# Patient Record
Sex: Female | Born: 1975 | Race: White | Hispanic: No | Marital: Married | Smoking: Former smoker
Health system: Southern US, Community
[De-identification: ages and names within clinical notes are randomized; demographics above are authoritative.]

## PROBLEM LIST (undated history)

## (undated) DIAGNOSIS — L732 Hidradenitis suppurativa: Secondary | ICD-10-CM

## (undated) DIAGNOSIS — E119 Type 2 diabetes mellitus without complications: Secondary | ICD-10-CM

## (undated) DIAGNOSIS — K3184 Gastroparesis: Secondary | ICD-10-CM

## (undated) DIAGNOSIS — E1140 Type 2 diabetes mellitus with diabetic neuropathy, unspecified: Secondary | ICD-10-CM

## (undated) DIAGNOSIS — G43909 Migraine, unspecified, not intractable, without status migrainosus: Secondary | ICD-10-CM

## (undated) HISTORY — PX: ANTERIOR CRUCIATE LIGAMENT REPAIR: SHX115

## (undated) HISTORY — PX: TUBAL LIGATION: SHX77

---

## 2006-11-19 ENCOUNTER — Emergency Department: Payer: Self-pay | Admitting: General Practice

## 2007-03-28 ENCOUNTER — Ambulatory Visit: Payer: Self-pay | Admitting: Specialist

## 2009-12-25 ENCOUNTER — Emergency Department: Payer: Self-pay | Admitting: Emergency Medicine

## 2011-11-26 ENCOUNTER — Emergency Department: Payer: Self-pay | Admitting: *Deleted

## 2011-11-27 ENCOUNTER — Emergency Department: Payer: Self-pay | Admitting: Emergency Medicine

## 2011-11-27 LAB — BASIC METABOLIC PANEL
Anion Gap: 7 (ref 7–16)
BUN: 8 mg/dL (ref 7–18)
Calcium, Total: 8.6 mg/dL (ref 8.5–10.1)
Co2: 26 mmol/L (ref 21–32)
Creatinine: 0.61 mg/dL (ref 0.60–1.30)
EGFR (African American): >60
Glucose: 131 mg/dL — ABNORMAL HIGH (ref 65–99)
Osmolality: 280 (ref 275–301)
Potassium: 3.7 mmol/L (ref 3.5–5.1)
Sodium: 140 mmol/L (ref 136–145)

## 2011-11-27 LAB — CBC WITH DIFFERENTIAL/PLATELET
Basophil %: 0.3 %
Eosinophil %: 3.1 %
Lymphocyte %: 16.7 %
MCH: 28.1 pg (ref 26.0–34.0)
MCV: 89 fL (ref 80–100)
Monocyte #: 0.6 x10 3/mm (ref 0.2–0.9)
Monocyte %: 4.3 %
Neutrophil #: 10.8 10*3/uL — ABNORMAL HIGH (ref 1.4–6.5)
Platelet: 300 10*3/uL (ref 150–440)
RDW: 14.6 % — ABNORMAL HIGH (ref 11.5–14.5)
WBC: 14.4 10*3/uL — ABNORMAL HIGH (ref 3.6–11.0)

## 2011-12-22 ENCOUNTER — Emergency Department: Payer: Self-pay | Admitting: *Deleted

## 2011-12-22 LAB — URINALYSIS, COMPLETE
Bacteria: NONE SEEN
Bilirubin,UR: NEGATIVE
Ketone: NEGATIVE
Leukocyte Esterase: NEGATIVE
Nitrite: NEGATIVE
Ph: 6 (ref 4.5–8.0)
Protein: 100
RBC,UR: 2519 /HPF (ref 0–5)
Squamous Epithelial: 2

## 2011-12-22 LAB — BASIC METABOLIC PANEL
Calcium, Total: 9.1 mg/dL (ref 8.5–10.1)
Chloride: 109 mmol/L — ABNORMAL HIGH (ref 98–107)
Creatinine: 0.8 mg/dL (ref 0.60–1.30)
EGFR (African American): >60
EGFR (Non-African Amer.): >60
Osmolality: 287 (ref 275–301)
Potassium: 3.8 mmol/L (ref 3.5–5.1)
Sodium: 141 mmol/L (ref 136–145)

## 2011-12-22 LAB — CBC
HCT: 37.9 % (ref 35.0–47.0)
HGB: 12.6 g/dL (ref 12.0–16.0)
MCHC: 33.2 g/dL (ref 32.0–36.0)
MCV: 88 fL (ref 80–100)
WBC: 9.6 10*3/uL (ref 3.6–11.0)

## 2011-12-22 LAB — PREGNANCY, URINE: Pregnancy Test, Urine: NEGATIVE m[IU]/mL

## 2012-04-24 ENCOUNTER — Emergency Department: Payer: Self-pay | Admitting: Emergency Medicine

## 2012-04-24 LAB — BASIC METABOLIC PANEL
BUN: 12 mg/dL (ref 7–18)
Calcium, Total: 9 mg/dL (ref 8.5–10.1)
Chloride: 102 mmol/L (ref 98–107)
Co2: 27 mmol/L (ref 21–32)
Potassium: 3.4 mmol/L — ABNORMAL LOW (ref 3.5–5.1)
Sodium: 138 mmol/L (ref 136–145)

## 2012-04-24 LAB — CBC
HCT: 36.5 % (ref 35.0–47.0)
MCH: 29.2 pg (ref 26.0–34.0)
Platelet: 315 10*3/uL (ref 150–440)
RBC: 4.23 10*6/uL (ref 3.80–5.20)
WBC: 12.4 10*3/uL — ABNORMAL HIGH (ref 3.6–11.0)

## 2012-04-24 LAB — ACETAMINOPHEN LEVEL: Acetaminophen: <2 ug/mL

## 2012-11-21 ENCOUNTER — Emergency Department: Payer: Self-pay | Admitting: Emergency Medicine

## 2012-11-21 LAB — URINALYSIS, COMPLETE
Bilirubin,UR: NEGATIVE
Blood: NEGATIVE
Ph: 5 (ref 4.5–8.0)
Protein: 100
RBC,UR: 1 /HPF (ref 0–5)
Specific Gravity: 1.028 (ref 1.003–1.030)
Squamous Epithelial: 3
WBC UR: 4 /HPF (ref 0–5)

## 2012-11-21 LAB — COMPREHENSIVE METABOLIC PANEL
Albumin: 3.2 g/dL — ABNORMAL LOW (ref 3.4–5.0)
Alkaline Phosphatase: 181 U/L — ABNORMAL HIGH (ref 50–136)
Anion Gap: 5 — ABNORMAL LOW (ref 7–16)
BUN: 9 mg/dL (ref 7–18)
Bilirubin,Total: 0.2 mg/dL (ref 0.2–1.0)
Calcium, Total: 9.5 mg/dL (ref 8.5–10.1)
Chloride: 101 mmol/L (ref 98–107)
Co2: 28 mmol/L (ref 21–32)
EGFR (African American): >60
Osmolality: 273 (ref 275–301)
SGPT (ALT): 32 U/L (ref 12–78)
Total Protein: 8.9 g/dL — ABNORMAL HIGH (ref 6.4–8.2)

## 2012-11-21 LAB — CBC
HCT: 35.9 % (ref 35.0–47.0)
MCHC: 32.8 g/dL (ref 32.0–36.0)
MCV: 84 fL (ref 80–100)
Platelet: 475 10*3/uL — ABNORMAL HIGH (ref 150–440)
RBC: 4.3 10*6/uL (ref 3.80–5.20)
WBC: 19.5 10*3/uL — ABNORMAL HIGH (ref 3.6–11.0)

## 2012-11-21 LAB — LIPASE, BLOOD: Lipase: 77 U/L (ref 73–393)

## 2012-11-27 LAB — CULTURE, BLOOD (SINGLE)

## 2013-05-29 ENCOUNTER — Ambulatory Visit: Payer: Self-pay | Admitting: Physician Assistant

## 2013-05-29 LAB — URINALYSIS, COMPLETE
Bilirubin,UR: NEGATIVE
Blood: NEGATIVE
Glucose,UR: NEGATIVE mg/dL (ref 0–75)
Ketone: NEGATIVE
Nitrite: NEGATIVE
Ph: 6 (ref 4.5–8.0)
RBC,UR: NONE SEEN /HPF (ref 0–5)

## 2013-07-25 ENCOUNTER — Ambulatory Visit: Payer: Self-pay | Admitting: Family Medicine

## 2013-08-20 ENCOUNTER — Ambulatory Visit: Payer: Self-pay | Admitting: Family Medicine

## 2014-01-05 ENCOUNTER — Ambulatory Visit: Payer: Self-pay | Admitting: Family Medicine

## 2014-01-05 LAB — CBC WITH DIFFERENTIAL/PLATELET
BASOS ABS: 0.1 10*3/uL (ref 0.0–0.1)
Basophil %: 0.7 %
Eosinophil #: 0.4 10*3/uL (ref 0.0–0.7)
Eosinophil %: 4.4 %
HCT: 39.3 % (ref 35.0–47.0)
HGB: 12.9 g/dL (ref 12.0–16.0)
Lymphocyte #: 2.4 10*3/uL (ref 1.0–3.6)
Lymphocyte %: 27.3 %
MCH: 28.2 pg (ref 26.0–34.0)
MCHC: 32.7 g/dL (ref 32.0–36.0)
MCV: 86 fL (ref 80–100)
MONOS PCT: 5.9 %
Monocyte #: 0.5 x10 3/mm (ref 0.2–0.9)
Neutrophil #: 5.5 10*3/uL (ref 1.4–6.5)
Neutrophil %: 61.7 %
Platelet: 304 10*3/uL (ref 150–440)
RBC: 4.56 10*6/uL (ref 3.80–5.20)
RDW: 14.2 % (ref 11.5–14.5)
WBC: 8.9 10*3/uL (ref 3.6–11.0)

## 2016-08-20 ENCOUNTER — Inpatient Hospital Stay
Admission: EM | Admit: 2016-08-20 | Discharge: 2016-08-21 | DRG: 872 | Disposition: A | Discharge status: Home / Self Care | Payer: Self-pay | Attending: Specialist | Admitting: Specialist

## 2016-08-20 ENCOUNTER — Encounter: Payer: Self-pay | Admitting: Emergency Medicine

## 2016-08-20 DIAGNOSIS — Z818 Family history of other mental and behavioral disorders: Secondary | ICD-10-CM

## 2016-08-20 DIAGNOSIS — A419 Sepsis, unspecified organism: Principal | ICD-10-CM | POA: Diagnosis present

## 2016-08-20 DIAGNOSIS — Z8249 Family history of ischemic heart disease and other diseases of the circulatory system: Secondary | ICD-10-CM

## 2016-08-20 DIAGNOSIS — I872 Venous insufficiency (chronic) (peripheral): Secondary | ICD-10-CM | POA: Diagnosis present

## 2016-08-20 DIAGNOSIS — Z599 Problem related to housing and economic circumstances, unspecified: Secondary | ICD-10-CM

## 2016-08-20 DIAGNOSIS — L732 Hidradenitis suppurativa: Secondary | ICD-10-CM | POA: Diagnosis present

## 2016-08-20 DIAGNOSIS — L723 Sebaceous cyst: Secondary | ICD-10-CM | POA: Diagnosis present

## 2016-08-20 DIAGNOSIS — G43909 Migraine, unspecified, not intractable, without status migrainosus: Secondary | ICD-10-CM | POA: Diagnosis present

## 2016-08-20 DIAGNOSIS — Z9119 Patient's noncompliance with other medical treatment and regimen: Secondary | ICD-10-CM

## 2016-08-20 DIAGNOSIS — L089 Local infection of the skin and subcutaneous tissue, unspecified: Secondary | ICD-10-CM

## 2016-08-20 DIAGNOSIS — Z833 Family history of diabetes mellitus: Secondary | ICD-10-CM

## 2016-08-20 DIAGNOSIS — L02811 Cutaneous abscess of head [any part, except face]: Secondary | ICD-10-CM | POA: Diagnosis present

## 2016-08-20 DIAGNOSIS — Z91030 Bee allergy status: Secondary | ICD-10-CM

## 2016-08-20 DIAGNOSIS — Z794 Long term (current) use of insulin: Secondary | ICD-10-CM

## 2016-08-20 DIAGNOSIS — E119 Type 2 diabetes mellitus without complications: Secondary | ICD-10-CM

## 2016-08-20 DIAGNOSIS — L03818 Cellulitis of other sites: Secondary | ICD-10-CM

## 2016-08-20 DIAGNOSIS — Z7982 Long term (current) use of aspirin: Secondary | ICD-10-CM

## 2016-08-20 DIAGNOSIS — E1165 Type 2 diabetes mellitus with hyperglycemia: Secondary | ICD-10-CM | POA: Diagnosis present

## 2016-08-20 HISTORY — DX: Type 2 diabetes mellitus without complications: E11.9

## 2016-08-20 HISTORY — DX: Migraine, unspecified, not intractable, without status migrainosus: G43.909

## 2016-08-20 HISTORY — DX: Hidradenitis suppurativa: L73.2

## 2016-08-20 LAB — CBC WITH DIFFERENTIAL/PLATELET
Basophils Absolute: 0.1 10*3/uL (ref 0–0.1)
Basophils Relative: 0 %
EOS ABS: 0.2 10*3/uL (ref 0–0.7)
EOS PCT: 1 %
HCT: 44.5 % (ref 35.0–47.0)
Hemoglobin: 15 g/dL (ref 12.0–16.0)
LYMPHS ABS: 2.4 10*3/uL (ref 1.0–3.6)
Lymphocytes Relative: 13 %
MCH: 28.5 pg (ref 26.0–34.0)
MCHC: 33.7 g/dL (ref 32.0–36.0)
MCV: 84.6 fL (ref 80.0–100.0)
MONOS PCT: 4 %
Monocytes Absolute: 0.8 10*3/uL (ref 0.2–0.9)
Neutro Abs: 15.8 10*3/uL — ABNORMAL HIGH (ref 1.4–6.5)
Neutrophils Relative %: 82 %
PLATELETS: 314 10*3/uL (ref 150–440)
RBC: 5.26 MIL/uL — AB (ref 3.80–5.20)
RDW: 13.7 % (ref 11.5–14.5)
WBC: 19.4 10*3/uL — AB (ref 3.6–11.0)

## 2016-08-20 LAB — COMPREHENSIVE METABOLIC PANEL
ALT: 20 U/L (ref 14–54)
ANION GAP: 13 (ref 5–15)
AST: 15 U/L (ref 15–41)
Albumin: 4 g/dL (ref 3.5–5.0)
Alkaline Phosphatase: 142 U/L — ABNORMAL HIGH (ref 38–126)
BUN: 11 mg/dL (ref 6–20)
CHLORIDE: 100 mmol/L — AB (ref 101–111)
CO2: 20 mmol/L — AB (ref 22–32)
Calcium: 9.2 mg/dL (ref 8.9–10.3)
Creatinine, Ser: 0.76 mg/dL (ref 0.44–1.00)
GFR calc non Af Amer: >60 mL/min (ref >60)
Glucose, Bld: 449 mg/dL — ABNORMAL HIGH (ref 65–99)
Potassium: 3.7 mmol/L (ref 3.5–5.1)
Sodium: 133 mmol/L — ABNORMAL LOW (ref 135–145)
Total Bilirubin: 1 mg/dL (ref 0.3–1.2)
Total Protein: 8.5 g/dL — ABNORMAL HIGH (ref 6.5–8.1)

## 2016-08-20 LAB — GLUCOSE, CAPILLARY
GLUCOSE-CAPILLARY: 435 mg/dL — AB (ref 65–99)
GLUCOSE-CAPILLARY: 369 mg/dL — AB (ref 65–99)
GLUCOSE-CAPILLARY: 293 mg/dL — AB (ref 65–99)
GLUCOSE-CAPILLARY: 326 mg/dL — AB (ref 65–99)
Glucose-Capillary: 387 mg/dL — ABNORMAL HIGH (ref 65–99)

## 2016-08-20 LAB — URINALYSIS, COMPLETE (UACMP) WITH MICROSCOPIC
Bilirubin Urine: NEGATIVE
HGB URINE DIPSTICK: NEGATIVE
KETONES UR: 80 mg/dL — AB
NITRITE: NEGATIVE
PH: 6 (ref 5.0–8.0)
PROTEIN: 30 mg/dL — AB
Specific Gravity, Urine: 1.037 — ABNORMAL HIGH (ref 1.005–1.030)

## 2016-08-20 LAB — LACTIC ACID, PLASMA: Lactic Acid, Venous: 1.6 mmol/L (ref 0.5–1.9)

## 2016-08-20 MED ORDER — ONDANSETRON HCL 4 MG/2ML IJ SOLN
4.0000 mg | Freq: Once | INTRAMUSCULAR | Status: AC
  Administered 2016-08-20: 4 mg via INTRAVENOUS
  Filled 2016-08-20: qty 2

## 2016-08-20 MED ORDER — ASPIRIN-ACETAMINOPHEN-CAFFEINE 250-250-65 MG PO TABS
1.0000 | ORAL_TABLET | Freq: Four times a day (QID) | ORAL | Status: DC | PRN
  Administered 2016-08-20 – 2016-08-21 (×3): 1 via ORAL
  Filled 2016-08-20 (×6): qty 1

## 2016-08-20 MED ORDER — VANCOMYCIN HCL 10 G IV SOLR
1250.0000 mg | Freq: Two times a day (BID) | INTRAVENOUS | Status: DC
  Administered 2016-08-20 – 2016-08-21 (×2): 1250 mg via INTRAVENOUS
  Filled 2016-08-20 (×3): qty 1250

## 2016-08-20 MED ORDER — SODIUM CHLORIDE 0.9 % IV BOLUS (SEPSIS)
1000.0000 mL | Freq: Once | INTRAVENOUS | Status: AC
  Administered 2016-08-20: 1000 mL via INTRAVENOUS

## 2016-08-20 MED ORDER — INSULIN ASPART 100 UNIT/ML ~~LOC~~ SOLN
0.0000 [IU] | Freq: Every day | SUBCUTANEOUS | Status: DC
  Administered 2016-08-20: 4 [IU] via SUBCUTANEOUS
  Filled 2016-08-20: qty 4

## 2016-08-20 MED ORDER — SODIUM CHLORIDE 0.9 % IV SOLN
INTRAVENOUS | Status: AC
  Administered 2016-08-20: 14:00:00 via INTRAVENOUS

## 2016-08-20 MED ORDER — ONDANSETRON HCL 4 MG PO TABS: 4.0000 mg | ORAL_TABLET | Freq: Four times a day (QID) | ORAL | Status: DC | PRN

## 2016-08-20 MED ORDER — CEFTRIAXONE SODIUM-DEXTROSE 1-3.74 GM-% IV SOLR
1.0000 g | Freq: Once | INTRAVENOUS | Status: AC
  Administered 2016-08-20: 1 g via INTRAVENOUS
  Filled 2016-08-20: qty 50

## 2016-08-20 MED ORDER — ENOXAPARIN SODIUM 40 MG/0.4ML ~~LOC~~ SOLN
40.0000 mg | SUBCUTANEOUS | Status: DC
  Administered 2016-08-20: 40 mg via SUBCUTANEOUS
  Filled 2016-08-20: qty 0.4

## 2016-08-20 MED ORDER — SODIUM CHLORIDE 0.9 % IV SOLN
Freq: Once | INTRAVENOUS | Status: AC
  Administered 2016-08-20: 12:00:00 via INTRAVENOUS

## 2016-08-20 MED ORDER — MORPHINE SULFATE (PF) 4 MG/ML IV SOLN
4.0000 mg | Freq: Once | INTRAVENOUS | Status: AC
  Administered 2016-08-20: 4 mg via INTRAVENOUS
  Filled 2016-08-20: qty 1

## 2016-08-20 MED ORDER — INSULIN ASPART 100 UNIT/ML ~~LOC~~ SOLN
0.0000 [IU] | Freq: Three times a day (TID) | SUBCUTANEOUS | Status: DC
  Administered 2016-08-20: 8 [IU] via SUBCUTANEOUS
  Administered 2016-08-20 – 2016-08-21 (×2): 15 [IU] via SUBCUTANEOUS
  Administered 2016-08-21: 8 [IU] via SUBCUTANEOUS
  Filled 2016-08-20: qty 15
  Filled 2016-08-20 (×2): qty 8
  Filled 2016-08-20: qty 15

## 2016-08-20 MED ORDER — ACETAMINOPHEN 325 MG PO TABS
650.0000 mg | ORAL_TABLET | Freq: Four times a day (QID) | ORAL | Status: DC | PRN
Start: 2016-08-20 — End: 2016-08-21
  Administered 2016-08-20 – 2016-08-21 (×2): 650 mg via ORAL
  Filled 2016-08-20: qty 2

## 2016-08-20 MED ORDER — LORATADINE 10 MG PO TABS
10.0000 mg | ORAL_TABLET | Freq: Every day | ORAL | Status: DC
  Administered 2016-08-21: 10 mg via ORAL
  Filled 2016-08-20: qty 1

## 2016-08-20 MED ORDER — LISINOPRIL 5 MG PO TABS
5.0000 mg | ORAL_TABLET | Freq: Every day | ORAL | Status: DC
  Administered 2016-08-21: 5 mg via ORAL
  Filled 2016-08-20: qty 1

## 2016-08-20 MED ORDER — INSULIN DETEMIR 100 UNIT/ML ~~LOC~~ SOLN
10.0000 [IU] | Freq: Two times a day (BID) | SUBCUTANEOUS | Status: DC
  Administered 2016-08-20 – 2016-08-21 (×2): 10 [IU] via SUBCUTANEOUS
  Filled 2016-08-20 (×3): qty 0.1

## 2016-08-20 MED ORDER — ASPIRIN EC 81 MG PO TBEC
81.0000 mg | DELAYED_RELEASE_TABLET | Freq: Every day | ORAL | Status: DC
  Administered 2016-08-21: 81 mg via ORAL
  Filled 2016-08-20: qty 1

## 2016-08-20 MED ORDER — PROCHLORPERAZINE EDISYLATE 5 MG/ML IJ SOLN
10.0000 mg | Freq: Once | INTRAMUSCULAR | Status: AC
  Administered 2016-08-20: 10 mg via INTRAVENOUS
  Filled 2016-08-20: qty 2

## 2016-08-20 MED ORDER — GABAPENTIN 300 MG PO CAPS
300.0000 mg | ORAL_CAPSULE | Freq: Every day | ORAL | Status: DC
  Administered 2016-08-20: 300 mg via ORAL
  Filled 2016-08-20: qty 1

## 2016-08-20 MED ORDER — SALINE SPRAY 0.65 % NA SOLN
1.0000 | NASAL | Status: DC | PRN
  Filled 2016-08-20: qty 44

## 2016-08-20 MED ORDER — DOCUSATE SODIUM 100 MG PO CAPS
100.0000 mg | ORAL_CAPSULE | Freq: Every day | ORAL | Status: DC
  Administered 2016-08-21: 100 mg via ORAL
  Filled 2016-08-20: qty 1

## 2016-08-20 MED ORDER — VANCOMYCIN HCL IN DEXTROSE 1-5 GM/200ML-% IV SOLN
1000.0000 mg | Freq: Once | INTRAVENOUS | Status: AC
  Administered 2016-08-20: 1000 mg via INTRAVENOUS
  Filled 2016-08-20: qty 200

## 2016-08-20 MED ORDER — ACETAMINOPHEN 650 MG RE SUPP: 650.0000 mg | Freq: Four times a day (QID) | RECTAL | Status: DC | PRN

## 2016-08-20 MED ORDER — ONDANSETRON HCL 4 MG/2ML IJ SOLN: 4.0000 mg | Freq: Four times a day (QID) | INTRAMUSCULAR | Status: DC | PRN

## 2016-08-20 MED ORDER — DEXTROSE 5 % IV SOLN: 1.0000 g | Freq: Once | INTRAVENOUS | Status: DC

## 2016-08-20 NOTE — Consult Note (Signed)
WOC Nurse wound consult note Reason for Consult: History of hidradenitis suppurativa.  ID has been consulted for large lesion to occiput area.  Draining and purulent.  Is on antibiotic coverage.  Other lesions are stable.  Will recommend silver skin fold management for any at risk areas.   Wound type:inflammatory/infectious Pressure Injury POA:N/A Measurement: 4 cm x 5 cm  Wound ZOX:WRUEAVWUJbed:purulence noted Drainage (amount, consistency, odor)purulent  Periwound: erythema and edematous Dressing procedure/placement/frequency:patient to bathe daily with soap and water and pat gently dry.  May use Interdry to any skin folds that have inflamed areas.  Measure and cut length of InterDry Ag+ to fit in skin folds that have skin breakdown Tuck InterDry  Ag+ fabric into skin folds in a single layer, allow for 2 inches of overhang from skin edges to allow for wicking to occur May remove to bathe; dry area thoroughly and then tuck into affected areas again Do not apply any creams or ointments when using InterDry Ag+ DO NOT THROW AWAY FOR 5 DAYS unless soiled with stool DO NOT [facility] product, this will inactivate the silver in the material  New sheet of Interdry Ag+ should be applied after 5 days of use if patient continues to have skin breakdown   Will not follow at this time.  Please re-consult if needed.  Maple HudsonKaren Linet Brash RN BSN CWON Pager [phone]

## 2016-08-20 NOTE — Progress Notes (Addendum)
Pharmacy Antibiotic Note  Brenda Mccarthy is a 41 y.o. female admitted on 08/20/2016 with Abscess on head/purulent drainage and Sepsis. Pharmacy has been consulted for Vancomycin dosing.  Hx hidradenitis suppurativa, DM.  Plan: Patient received CTX x 1 in ER and Vancomycin 1 gram IV x 1 in ER. Vancomycin 1250 IV every 12 hours.  Goal trough 15-20 mcg/mL. Will give stacked dosing.  Ke 0.084, t1/2 8.25  Vd 51.8 Vancomycin trough prior to 5th dose on 3/3.    Height: 5\' 9"  (175.3 cm) Weight: 189 lb (85.7 kg) IBW/kg (Calculated) : 66.2  Temp (24hrs), Avg:98.6 F (37 C), Min:98.1 F (36.7 C), Max:99 F (37.2 C)   Recent Labs Lab 08/20/16 1037 08/20/16 1140  WBC 19.4*  --   CREATININE 0.76  --   LATICACIDVEN  --  1.6    Estimated Creatinine Clearance: 107 mL/min (by C-G formula based on SCr of 0.76 mg/dL).    Allergies  Allergen Reactions  . Bee Venom Anaphylaxis  . Other     Throat swelling with bleach    Antimicrobials this admission: CTX x 1   >>   Vancomycin  3/1 >>    Dose adjustments this admission:    Microbiology results: 3/1 BCx: pending 3/1 UCx: pending    Sputum:      MRSA PCR:    Thank you for allowing pharmacy to be a part of this patient's care.  Zaxton Angerer A 08/20/2016 3:44 PM

## 2016-08-20 NOTE — Progress Notes (Signed)
[facility]. Patient is alert and oriented and able to verbalize needs. Oriented to staff and call bell system. IVF running at 16625ml/hr. Will continue to monitor.

## 2016-08-20 NOTE — ED Notes (Signed)
Patients daughter, Charline BillsKristyn [phone] was called and updated on patients room update per patients request.

## 2016-08-20 NOTE — Consult Note (Signed)
Patient ID: Brenda PinksElizabeth Michelle Mccarthy, female   DOB: 10/06/1975, 41 y.o.   MRN: [id number]  CC: Head Abscess  HPI Brenda Mccarthy is a 41 y.o. female who is currently admitted to the medicine service for an infected cyst on the back of her head. Surgery consult was requested by Dr.Gouru. Per the patient's report the areas been on the back of her head for numerous days and she's been attempting to pop it and drain it herself. She's had numerous of these on the back of her head in the past. She also is a known insulin-dependent diabetic who has not been taking any insulin. She also has a history of hidradenitis and takes a baseline doxycycline for prophylaxis. Patient denies any fevers, chills, nausea, vomiting, chest pain, shortness of breath, diarrhea, constipation. Her only real complaint is of pain to the area where she's been mashing on the cyst. It has spontaneously drained per the patient.  HPI  Past Medical History:  Diagnosis Date  . Diabetes mellitus without complication (HCC)   . Hydradenitis   . Migraine     Past Surgical History:  Procedure Laterality Date  . ANTERIOR CRUCIATE LIGAMENT REPAIR      Family History  Problem Relation Age of Onset  . Bipolar disorder Mother   . Diabetes Mother   . Heart disease Mother     Social History Social History  Substance Use Topics  . Smoking status: Never Smoker  . Smokeless tobacco: Never Used  . Alcohol use No    Allergies  Allergen Reactions  . Bee Venom Anaphylaxis  . Other     Throat swelling with bleach    Current Facility-Administered Medications  Medication Dose Route Frequency Provider Last Rate Last Dose  . 0.9 %  sodium chloride infusion   Intravenous Continuous Brenda LabAruna Gouru, MD 125 mL/hr at 08/20/16 1354    . acetaminophen (TYLENOL) tablet 650 mg  650 mg Oral Q6H PRN Brenda LabAruna Gouru, MD   650 mg at 08/20/16 1652   Or  . acetaminophen (TYLENOL) suppository 650 mg  650 mg Rectal Q6H PRN Brenda LabAruna Gouru, MD       . aspirin EC tablet 81 mg  81 mg Oral Daily Brenda LabAruna Gouru, MD      . aspirin-acetaminophen-caffeine (EXCEDRIN MIGRAINE) per tablet 1 tablet  1 tablet Oral Q6H PRN Brenda LabAruna Gouru, MD      . docusate sodium (COLACE) capsule 100 mg  100 mg Oral Daily Aruna Gouru, MD      . enoxaparin (LOVENOX) injection 40 mg  40 mg Subcutaneous Q24H Aruna Gouru, MD      . gabapentin (NEURONTIN) capsule 300 mg  300 mg Oral QHS Aruna Gouru, MD      . insulin aspart (novoLOG) injection 0-15 Units  0-15 Units Subcutaneous TID WC Brenda LabAruna Gouru, MD   8 Units at 08/20/16 1653  . insulin aspart (novoLOG) injection 0-5 Units  0-5 Units Subcutaneous QHS Aruna Gouru, MD      . insulin detemir (LEVEMIR) injection 10 Units  10 Units Subcutaneous BID Deanna ArtisAruna Gouru, MD      . lisinopril (PRINIVIL,ZESTRIL) tablet 5 mg  5 mg Oral Daily Aruna Gouru, MD      . loratadine (CLARITIN) tablet 10 mg  10 mg Oral Daily Aruna Gouru, MD      . ondansetron (ZOFRAN) tablet 4 mg  4 mg Oral Q6H PRN Brenda LabAruna Gouru, MD       Or  . ondansetron (ZOFRAN) injection 4 mg  4 mg Intravenous Q6H PRN Brenda Lab, MD      . sodium chloride (OCEAN) 0.65 % nasal spray 1 spray  1 spray Each Nare PRN Brenda Lab, MD      . vancomycin (VANCOCIN) 1,250 mg in sodium chloride 0.9 % 250 mL IVPB  1,250 mg Intravenous Q12H Brenda Lab, MD   1,250 mg at 08/20/16 1653     Review of Systems A Multi-point review of systems was asked and was negative except for the findings documented in the history of present illness  Physical Exam Blood pressure 127/75, pulse (!) 106, temperature 98.1 F (36.7 C), temperature source Oral, resp. rate 18, height 5\' 9"  (1.753 m), weight 85.7 kg (189 lb), last menstrual period 08/05/2016, SpO2 100 %. CONSTITUTIONAL: Resting in bed in no acute distress. EYES: Pupils are equal, round, and reactive to light, Sclera are non-icteric. EARS, NOSE, MOUTH AND THROAT: The oropharynx is clear. The oral mucosa is pink and moist. Hearing is intact to  voice. LYMPH NODES:  Lymph nodes in the neck are normal. RESPIRATORY:  Lungs are clear. There is normal respiratory effort, with equal breath sounds bilaterally, and without pathologic use of accessory muscles. CARDIOVASCULAR: Heart is regular without murmurs, gallops, or rubs. GI: The abdomen is soft, nontender, and nondistended. There are no palpable masses. There is no hepatosplenomegaly. There are normal bowel sounds in all quadrants. GU: Rectal deferred.   MUSCULOSKELETAL: Normal muscle strength and tone. No cyanosis or edema.   SKIN: Within the hair of the posterior head there is a 1 cm opening in the middle of an approximate 3 cm cyst. There is no palpable fluctuance. There is minimal hyperemia and induration. NEUROLOGIC: Motor and sensation is grossly normal. Cranial nerves are grossly intact. PSYCH:  Oriented to person, place and time. Affect is normal.  Data Reviewed labs reviewed; labs concerning for leukocytosis of 19.4, a hyperglycemia of 449, mild hyponatremia of 133 likely related to her hyperglycemia. Urinalysis reviewed which appears to be contaminated but does show evidence of bacteria and some leukocytes. There are no images to review I have personally reviewed the patient's imaging, laboratory findings and medical records.    Assessment    Uncontrolled diabetic with an infected sebaceous cyst    Plan    41 year old female admitted to the medicine service with presumed sepsis from infected cyst on the posterior scalp. No palpable induration or undrained fluid collection on exam. Minimal hyperemia. Patient states she actually feels better. At this point there is no indication for surgical intervention in this very poorly controlled diabetic. Recommend continuing antibiotic therapy. Should the area worsen would possibly consider I&D, however there is no current palpable fluid collection for drainage.     Time spent with the patient was 55 minutes, with more than 50% of the  time spent in face-to-face education, counseling and care coordination.     Ricarda Frame, MD FACS General Surgeon 08/20/2016, 5:10 PM

## 2016-08-20 NOTE — H&P (Signed)
[facility] - [place] at [facility]   PATIENT NAME: Brenda Mccarthy    MR#:  [id number]  DATE OF BIRTH:  01-12-1976  DATE OF ADMISSION:  08/20/2016  PRIMARY CARE PHYSICIAN: No primary care provider on file.   REQUESTING/REFERRING PHYSICIAN: Malinda  CHIEF COMPLAINT:   Infection on the back of the head HISTORY OF PRESENT ILLNESS:  Brenda Mccarthy  is a 41 y.o. female with a known history of Insulin requiring diabetes mellitus, ran out of her insulin approximately one year ago, hydradenitis suppurativa takes doxycycline once daily for prophylaxis, is presenting to the ED with a chief complaint of swelling on the back of her head. Her blood sugar is at 400 and patient was initially tachycardic and white count is elevated at 19.4. Abscess was noticed on the back of her head and patient is started on IV Rocephin and vancomycin. Hospitalist team is called to admit the patient  PAST MEDICAL HISTORY:   Past Medical History:  Diagnosis Date  . Diabetes mellitus without complication (HCC)   . Hydradenitis   . Migraine     PAST SURGICAL HISTOIRY:   Past Surgical History:  Procedure Laterality Date  . ANTERIOR CRUCIATE LIGAMENT REPAIR      SOCIAL HISTORY:   Social History  Substance Use Topics  . Smoking status: Never Smoker  . Smokeless tobacco: Never Used  . Alcohol use No    FAMILY HISTORY:  DM   DRUG ALLERGIES:   Allergies  Allergen Reactions  . Bee Venom Anaphylaxis  . Other     Throat swelling with bleach    REVIEW OF SYSTEMS:  CONSTITUTIONAL: No fever, fatigue ; Reporting weakness.  EYES: No blurred or double vision.  EARS, NOSE, AND THROAT: No tinnitus or ear pain.  RESPIRATORY: No cough, shortness of breath, wheezing or hemoptysis.  CARDIOVASCULAR: No chest pain, orthopnea, edema.  GASTROINTESTINAL: No nausea, vomiting, diarrhea or abdominal pain.  GENITOURINARY: No dysuria, hematuria.  ENDOCRINE: No polyuria, nocturia,   HEMATOLOGY: No anemia, easy bruising or bleeding SKIN: Swelling on the back of her head No rash or lesion. MUSCULOSKELETAL: No joint pain or arthritis.   NEUROLOGIC: No tingling, numbness, weakness.  PSYCHIATRY: No anxiety or depression.   MEDICATIONS AT HOME:   Prior to Admission medications   Medication Sig Start Date End Date Taking? Authorizing Provider  aspirin EC 81 MG tablet Take 81 mg by mouth daily.   Yes Historical Provider, MD  doxycycline (ADOXA) 100 MG tablet Take by mouth daily.    Yes Historical Provider, MD  lisinopril (PRINIVIL,ZESTRIL) 5 MG tablet Take 5 mg by mouth daily.   Yes Historical Provider, MD  aspirin-acetaminophen-caffeine (EXCEDRIN MIGRAINE) [phone] MG tablet Take by mouth every 6 (six) hours as needed for headache.    Historical Provider, MD  cetirizine (ZYRTEC) 10 MG tablet Take 10 mg by mouth daily.    Historical Provider, MD  gabapentin (NEURONTIN) 300 MG capsule Take 300 mg by mouth daily as needed.    Historical Provider, MD  silver sulfADIAZINE (SILVADENE) 1 % cream Apply 1 application topically daily as needed.    Historical Provider, MD  sodium chloride (OCEAN) 0.65 % SOLN nasal spray Place 1 spray into both nostrils as needed for congestion.    Historical Provider, MD      VITAL SIGNS:  Blood pressure 113/69, pulse 99, temperature 99 F (37.2 C), temperature source Oral, resp. rate 17, height 5\' 9"  (1.753 m), weight 85.7 kg (189 lb), last menstrual period  08/05/2016, SpO2 100 %.  PHYSICAL EXAMINATION:  GENERAL:  41 y.o.-year-old patient lying in the bed with no acute distress.  EYES: Pupils equal, round, reactive to light and accommodation. No scleral icterus. Extraocular muscles intact.  HEENT: Head atraumatic, normocephalic. Oropharynx and nasopharynx clear.  NECK:  Supple, no jugular venous distention. No thyroid enlargement, no tenderness.  LUNGS: Normal breath sounds bilaterally, no wheezing, rales,rhonchi or crepitation. No use of  accessory muscles of respiration.  CARDIOVASCULAR: S1, S2 normal. No murmurs, rubs, or gallops.  ABDOMEN: Soft, nontender, nondistended. Bowel sounds present. No organomegaly or mass.  EXTREMITIES: No pedal edema, cyanosis, or clubbing.  NEUROLOGIC: Cranial nerves II through XII are intact. Muscle strength 5/5 in all extremities. Sensation intact. Gait not checked.  PSYCHIATRIC: The patient is alert and oriented x 3.  SKIN:4 x 5 cm abscesses present in the occipital area is erythematous, tender with central crater no fluctuance  No obvious rash, lesion,   LABORATORY PANEL:   CBC  Recent Labs Lab 08/20/16 1037  WBC 19.4*  HGB 15.0  HCT 44.5  PLT 314   ------------------------------------------------------------------------------------------------------------------  Chemistries   Recent Labs Lab 08/20/16 1037  NA 133*  K 3.7  CL 100*  CO2 20*  GLUCOSE 449*  BUN 11  CREATININE 0.76  CALCIUM 9.2  AST 15  ALT 20  ALKPHOS 142*  BILITOT 1.0   ------------------------------------------------------------------------------------------------------------------  Cardiac Enzymes No results for input(s): TROPONINI in the last 168 hours. ------------------------------------------------------------------------------------------------------------------  RADIOLOGY:  No results found.  EKG:   Orders placed or performed during the hospital encounter of 08/20/16  . EKG 12-Lead  . EKG 12-Lead  . ED EKG  . ED EKG    IMPRESSION AND PLAN:   Brenda Mccarthy  is a 41 y.o. female with a known history of Insulin requiring diabetes mellitus, ran out of her insulin approximately one year ago, hydradenitis suppurativa is presenting to the ED with a chief complaint of swelling on the back of her head. Her blood sugar is at 400 and patient was initially tachycardic and white count is elevated at 19.4.  #Sepsis secondary to abscess in the occipital area of the scalp with history of  hydradenitis suppurativa Meets septic criteria with fever and tachycardia IV Rocephin and vancomycin were started in the emergency department will continue IV vancomycin Consult  surgery for possible I&D Pain management as needed  #Insulin requiring diabetes mellitus with hyperglycemia and noncompliance Patient was given 2 L fluid boluses We'll start her on Mod dose sliding Slade scale insulin and liver with 10 units twice a day. Patient was previously taking 75 units of Levemir twice a day which was discontinued by the patient because of the financial problems Consult diabetic coordinator Check hemoglobin A1c in a.m.  #History of hydradenitis suppurativa Patient was taking doxycycline once daily for prophylaxis. Will consider resuming doxycycline after completing the antibiotic course  #Chronic venous insufficiency Stable no interventions needed at this time  All the records are reviewed and case discussed with ED provider. Management plans discussed with the patient, family and they are in agreement.  CODE STATUS: fc , daughter is hcpoa  TOTAL TIME TAKING CARE OF THIS PATIENT: 45minutes.   Note: This dictation was prepared with Dragon dictation along with smaller phrase technology. Any transcriptional errors that result from this process are unintentional.  Ramonita LabGouru, Dedrick Heffner M.D on 08/20/2016 at 1:23 PM  Between 7am to 6pm - Pager - [phone]  After 6pm go to [email or web address] - password [email or web address]  [facility]  Office  [phone]  CC: Primary care physician; No primary care provider on file.

## 2016-08-20 NOTE — ED Provider Notes (Signed)
[facility] Emergency Department Provider Note   ____________________________________________   First MD Initiated Contact with Patient 08/20/16 1102     (approximate)  I have reviewed the triage vital signs and the nursing notes.   HISTORY  Chief Complaint Recurrent Skin Infections and Migraine  HPI Brenda Mccarthy is a 41 y.o. female who is diabetic but ran out of her diabetic medicines about a year ago and usually runs a blood sugar about 400 she says. She has recurrent boils and is supposed to be on doxycycline daily but only takes it when she has a problem because she cannot afford it. She developed a boil on the back of her neck on Sunday and popped it and a lot of pus out but it has since spread and gotten more tender now she is having a bad migraine as well. Bright light bothers her eyes a lot. She feels hot and cold as well.   Past Medical History:  Diagnosis Date  . Diabetes mellitus without complication (HCC)   . Hydradenitis   . Migraine     There are no active problems to display for this patient.   Past Surgical History:  Procedure Laterality Date  . ANTERIOR CRUCIATE LIGAMENT REPAIR      Prior to Admission medications   Not on File    Allergies Bee venom and Other  History reviewed. No pertinent family history.  Social History Social History  Substance Use Topics  . Smoking status: Never Smoker  . Smokeless tobacco: Never Used  . Alcohol use No    Review of Systems Constitutional:fever/chills Eyes: No visual changes. ENT: No sore throat. Cardiovascular: Denies chest pain. Respiratory: Denies shortness of breath. Gastrointestinal: No abdominal pain.  No nausea, no vomiting.  No diarrhea.  No constipation. Genitourinary: Negative for dysuria. Musculoskeletal: Negative for back pain. Skin: Negative for rash. Neurological: Negative for headaches, focal weakness or numbness.  10-point ROS otherwise  negative.  ____________________________________________   PHYSICAL EXAM:  VITAL SIGNS: ED Triage Vitals  Enc Vitals Group     BP 08/20/16 1027 139/83     Pulse Rate 08/20/16 1027 (!) 123     Resp 08/20/16 1055 17     Temp 08/20/16 1027 99 F (37.2 C)     Temp Source 08/20/16 1027 Oral     SpO2 08/20/16 1027 97 %     Weight 08/20/16 1019 189 lb (85.7 kg)     Height 08/20/16 1019 5\' 9"  (1.753 m)     Head Circumference --      Peak Flow --      Pain Score 08/20/16 1020 10     Pain Loc --      Pain Edu? --      Excl. in GC? --     Constitutional: Alert and oriented. Well appearing and in no acute distress. Eyes: Conjunctivae are normal. PERRL. EOMI. Head: Atraumatic Patient has a red area about the size of a $0.50 piece on the back of her head. It has an ulcer that is scabbed in it. It is very tender but not fluctuant. Patient also has a 1 cm area of firmness that is tender to the left and below that. He says that's what the not felt like before turned into a boil.. Nose: No congestion/rhinnorhea. Mouth/Throat: Mucous membranes are moist.  Oropharynx non-erythematous. Neck: No stridor. Neck is supple. Cardiovascular: Rapid rate, regular rhythm. Grossly normal heart sounds.  Good peripheral circulation. Respiratory: Normal respiratory effort.  No retractions. Lungs CTAB. Gastrointestinal: Soft and nontender. No distention. No abdominal bruits. No CVA tenderness.:  Musculoskeletal: No lower extremity tenderness nor edema.  No joint effusions. Neurologic:  Normal speech and language. No gross focal neurologic deficits are appreciated.  Skin patient reports she has a boil under her arm as well. I did not check that as she is lying on it and does not want to move because it hurts too much.  ____________________________________________   LABS (all labs ordered are listed, but only abnormal results are displayed)  Labs Reviewed  GLUCOSE, CAPILLARY - Abnormal; Notable for the  following:       Result Value   Glucose-Capillary 435 (*)    All other components within normal limits  CBC WITH DIFFERENTIAL/PLATELET - Abnormal; Notable for the following:    WBC 19.4 (*)    RBC 5.26 (*)    Neutro Abs 15.8 (*)    All other components within normal limits  COMPREHENSIVE METABOLIC PANEL - Abnormal; Notable for the following:    Sodium 133 (*)    Chloride 100 (*)    CO2 20 (*)    Glucose, Bld 449 (*)    Total Protein 8.5 (*)    Alkaline Phosphatase 142 (*)    All other components within normal limits  URINE CULTURE  LACTIC ACID, PLASMA  LACTIC ACID, PLASMA  URINALYSIS, COMPLETE (UACMP) WITH MICROSCOPIC  URINALYSIS, ROUTINE W REFLEX MICROSCOPIC  CBG MONITORING, ED  I-STAT CG4 LACTIC ACID, ED  I-STAT CG4 LACTIC ACID, ED   ____________________________________________  EKG  EKG read and interpreted by me shows sinus tachycardia rate of 127 left axis very poor R-wave progression. ____________________________________________  RADIOLOGY   ____________________________________________   PROCEDURES  Procedure(s) performed:  Procedures  Critical Care performed:   ____________________________________________   INITIAL IMPRESSION / ASSESSMENT AND PLAN / ED COURSE  Pertinent labs & imaging results that were available during my care of the patient were reviewed by me and considered in my medical decision making (see chart for details).        ____________________________________________   FINAL CLINICAL IMPRESSION(S) / ED DIAGNOSES  Final diagnoses:  Cellulitis of other specified site  Sepsis, due to unspecified organism [facility](HCC)      NEW MEDICATIONS STARTED DURING THIS VISIT:  New Prescriptions   No medications on file     Note:  This document was prepared using Dragon voice recognition software and may include unintentional dictation errors.    Arnaldo NatalPaul F Deston Bilyeu, MD 08/20/16 [phone]

## 2016-08-20 NOTE — ED Triage Notes (Signed)
Pt c/o pain to back of head since Saturday.  Hx multiple boils. Reports popped it and has been worse since. Redness similar to abscess to back of head.  Highest temp 99.1. Also c/o migraine.  Vomit when eats r/t pain.  NAD.

## 2016-08-20 NOTE — ED Notes (Signed)
Discussed pt with dr Darnelle Catalanmalinda. Labs ordered per recommendation r/t sugar

## 2016-08-20 NOTE — Progress Notes (Signed)
Patients heart rate is sustaining in 130s. Upon assessment patient has no complaints. Patient is in bed talking on phone with family. MD made aware.

## 2016-08-20 NOTE — Progress Notes (Signed)
Verbal order received for 1 liter NS bolus. IV bolus administered at this time. In room with patient. No complaints. HR in 120s. Will continue to monitor.

## 2016-08-21 LAB — COMPREHENSIVE METABOLIC PANEL
ALBUMIN: 3.2 g/dL — AB (ref 3.5–5.0)
ALK PHOS: 178 U/L — AB (ref 38–126)
ALT: 25 U/L (ref 14–54)
AST: 34 U/L (ref 15–41)
Anion gap: 8 (ref 5–15)
BILIRUBIN TOTAL: 0.8 mg/dL (ref 0.3–1.2)
BUN: 8 mg/dL (ref 6–20)
CALCIUM: 8.6 mg/dL — AB (ref 8.9–10.3)
CO2: 20 mmol/L — ABNORMAL LOW (ref 22–32)
Chloride: 106 mmol/L (ref 101–111)
Creatinine, Ser: 0.52 mg/dL (ref 0.44–1.00)
GFR calc Af Amer: >60 mL/min (ref >60)
GFR calc non Af Amer: >60 mL/min (ref >60)
GLUCOSE: 319 mg/dL — AB (ref 65–99)
Potassium: 3.4 mmol/L — ABNORMAL LOW (ref 3.5–5.1)
Sodium: 134 mmol/L — ABNORMAL LOW (ref 135–145)
TOTAL PROTEIN: 7 g/dL (ref 6.5–8.1)

## 2016-08-21 LAB — GLUCOSE, CAPILLARY
Glucose-Capillary: 278 mg/dL — ABNORMAL HIGH (ref 65–99)
Glucose-Capillary: 362 mg/dL — ABNORMAL HIGH (ref 65–99)

## 2016-08-21 LAB — TSH: TSH: 0.601 u[IU]/mL (ref 0.350–4.500)

## 2016-08-21 LAB — CBC
HEMATOCRIT: 37.5 % (ref 35.0–47.0)
Hemoglobin: 12.3 g/dL (ref 12.0–16.0)
MCH: 28 pg (ref 26.0–34.0)
MCHC: 32.9 g/dL (ref 32.0–36.0)
MCV: 85 fL (ref 80.0–100.0)
Platelets: 265 10*3/uL (ref 150–440)
RBC: 4.4 MIL/uL (ref 3.80–5.20)
RDW: 13.4 % (ref 11.5–14.5)
WBC: 14.9 10*3/uL — AB (ref 3.6–11.0)

## 2016-08-21 LAB — HIV ANTIBODY (ROUTINE TESTING W REFLEX): HIV Screen 4th Generation wRfx: NONREACTIVE

## 2016-08-21 MED ORDER — INSULIN NPH ISOPHANE & REGULAR (70-30) 100 UNIT/ML ~~LOC~~ SUSP: 18.0000 [IU] | Freq: Two times a day (BID) | SUBCUTANEOUS | 11 refills | Status: DC

## 2016-08-21 MED ORDER — SULFAMETHOXAZOLE-TRIMETHOPRIM 800-160 MG PO TABS: 1.0000 | ORAL_TABLET | Freq: Two times a day (BID) | ORAL | 0 refills | Status: AC

## 2016-08-21 NOTE — Progress Notes (Signed)
  Patient revisited with today. Patient reports that she had a headache overnight otherwise denies any complaints. She thinks the area on the posterior scalp is improved. Patient reports she thinks it drained some onto her bili last night. On exam the posterior infected sebaceous cyst is smaller in size and continues did not show any evidence of fluctuance or expressible fluid.  Doubtful that this small infected sebaceous cyst as the cause of systemic sepsis. However, there is no current surgically drainable fluid collection seen on examination.  Patient will need an outpatient surgical evaluation in 1-2 weeks for possible removal of the cyst when it is not infected.  Please call if the Gen. surgery team can be of any further assistance during this patient's hospital stay.  Ricarda Frameharles Posey Petrik, MD [facility] General Surgeon [facility]  Day [facility] [phone]-7p) [phone] Night [facility] [phone](7p-7a) [phone]

## 2016-08-21 NOTE — Care Management Note (Addendum)
Case Management Note  Patient Details  Name: Brenda Mccarthy MRN: [id number] Date of Birth: 11/11/1975  Subjective/Objective:   [facility]. Referral sent to Smiley HousemanLorrie [facility]. Patient has no PCP and has not taken any of her medications in over a year. She lives at home with her spouse. No other issues identified. Noted diabetic coordinators recommendations               Action/Plan: Verified that [facility] has 70/30 and they do  Expected Discharge Date:  08/21/16               Expected Discharge Plan:  Home/Self Care  In-House Referral:     Discharge planning Services  CM Consult, Indigent Health Clinic, Medication Assistance  Post Acute Care Choice:    Choice offered to:  Patient  DME Arranged:    DME Agency:     HH Arranged:    HH Agency:     Status of Service:  Completed, signed off  If discussed at [employer] Meetings, dates discussed:    Additional Comments:  Marily MemosLisa M Dossie Ocanas, RN 08/21/2016, 1:43 PM

## 2016-08-21 NOTE — Discharge Summary (Signed)
[employer] - [place] at [facility]   PATIENT NAME: Brenda Mccarthy    MR#:  [id number]  DATE OF BIRTH:  1976/04/17  DATE OF ADMISSION:  08/20/2016 ADMITTING PHYSICIAN: Ramonita Lab, MD  DATE OF DISCHARGE: 08/21/2016  PRIMARY CARE PHYSICIAN: No primary care provider on file.    ADMISSION DIAGNOSIS:  Sepsis, due to unspecified organism (HCC) [A41.9] Cellulitis of other specified site [L03.818]  DISCHARGE DIAGNOSIS:  Active Problems:   Sepsis (HCC)   Infected sebaceous cyst of skin   SECONDARY DIAGNOSIS:   Past Medical History:  Diagnosis Date  . Diabetes mellitus without complication (HCC)   . Hydradenitis   . Migraine     HOSPITAL COURSE:   41 yo female w/ hx of DM, hx of Hydradenitis suppurativa, DM who presented to the hospital with due to sepsis due to occipital celllulitis/hydradenitis suppurativa.   1. Sepsis - due to occipital cellulitis/Hidradenitis suppurativa. Patient presented with fever, leukocytosis and tachycardia. Patient was treated with IV vancomycin and Rocephin in the ER and started on IV vancomycin. A surgical consultation was obtained for possible incision and drainage. -Patient did not require any incision and drainage and her white cell count has improved and normalized with just IV antibiotics. Her swelling has improved, her fever resolved, patient is not being discharged on oral Bactrim and close follow-up with general surgery as an outpatient.  2. Type 2 diabetes insulin requiring-patient was noncompliant and not taking any insulin due to financial reasons. -Patient was seen by diabetes coordinator and was discharged on Novolin 70/30 18 units twice daily.  3. History of hidradenitis suppurativa-patient will continue her doxycycline upon discharge.  4. Occipital cellulitis -patient was treated with IV antibiotics with vancomycin in the hospital and not being discharged on oral Bactrim. -She will follow-up with general surgery as an  outpatient to see if she requires outpatient incision and drainage.  DISCHARGE CONDITIONS:   Stable.   CONSULTS OBTAINED:  Treatment Team:  Ricarda Frame, MD  DRUG ALLERGIES:   Allergies  Allergen Reactions  . Bee Venom Anaphylaxis  . Other     Throat swelling with bleach    DISCHARGE MEDICATIONS:   Allergies as of 08/21/2016      Reactions   Bee Venom Anaphylaxis   Other    Throat swelling with bleach      Medication List    TAKE these medications   aspirin EC 81 MG tablet Take 81 mg by mouth daily.   aspirin-acetaminophen-caffeine 250-250-65 MG tablet Commonly known as:  EXCEDRIN MIGRAINE Take by mouth every 6 (six) hours as needed for headache.   cetirizine 10 MG tablet Commonly known as:  ZYRTEC Take 10 mg by mouth daily.   doxycycline 100 MG tablet Commonly known as:  ADOXA Take by mouth daily.   gabapentin 300 MG capsule Commonly known as:  NEURONTIN Take 300 mg by mouth daily as needed.   insulin NPH-regular Human (70-30) 100 UNIT/ML injection Commonly known as:  NOVOLIN 70/30 Inject 18 Units into the skin 2 (two) times daily with a meal.   lisinopril 5 MG tablet Commonly known as:  PRINIVIL,ZESTRIL Take 5 mg by mouth daily.   silver sulfADIAZINE 1 % cream Commonly known as:  SILVADENE Apply 1 application topically daily as needed.   sodium chloride 0.65 % Soln nasal spray Commonly known as:  OCEAN Place 1 spray into both nostrils as needed for congestion.   sulfamethoxazole-trimethoprim 800-160 MG tablet Commonly known as:  BACTRIM DS,SEPTRA DS Take  1 tablet by mouth 2 (two) times daily.         DISCHARGE INSTRUCTIONS:   DIET:  Cardiac diet  DISCHARGE CONDITION:  Stable  ACTIVITY:  Activity as tolerated  OXYGEN:  Home Oxygen: No.   Oxygen Delivery: room air  DISCHARGE LOCATION:  home   If you experience worsening of your admission symptoms, develop shortness of breath, life threatening emergency, suicidal or  homicidal thoughts you must seek medical attention immediately by calling 911 or calling your MD immediately  if symptoms less severe.  You Must read complete instructions/literature along with all the possible adverse reactions/side effects for all the Medicines you take and that have been prescribed to you. Take any new Medicines after you have completely understood and accpet all the possible adverse reactions/side effects.   Please note  You were cared for by a hospitalist during your hospital stay. If you have any questions about your discharge medications or the care you received while you were in the hospital after you are discharged, you can call the unit and asked to speak with the hospitalist on call if the hospitalist that took care of you is not available. Once you are discharged, your primary care physician will handle any further medical issues. Please note that NO REFILLS for any discharge medications will be authorized once you are discharged, as it is imperative that you return to your primary care physician (or establish a relationship with a primary care physician if you do not have one) for your aftercare needs so that they can reassess your need for medications and monitor your lab values.     Today   Swelling in the Occipital area has improved.  NO fever, WBC count improving, no need for I &D. Will d/c home today.   VITAL SIGNS:  Blood pressure 130/71, pulse 99, temperature 98.2 F (36.8 C), temperature source Oral, resp. rate 19, height 5\' 9"  (1.753 m), weight 85.7 kg (189 lb), last menstrual period 08/05/2016, SpO2 100 %.  I/O:   Intake/Output Summary (Last 24 hours) at 08/21/16 1433 Last data filed at 08/21/16 0618  Gross per 24 hour  Intake           2467.5 ml  Output                0 ml  Net           2467.5 ml    PHYSICAL EXAMINATION:  GENERAL:  41 y.o.-year-old patient lying in the bed with no acute distress.  EYES: Pupils equal, round, reactive to light  and accommodation. No scleral icterus. Extraocular muscles intact.  HEENT: Head atraumatic, normocephalic. Oropharynx and nasopharynx clear.  NECK:  Supple, no jugular venous distention. No thyroid enlargement, no tenderness.  LUNGS: Normal breath sounds bilaterally, no wheezing, rales,rhonchi. No use of accessory muscles of respiration.  CARDIOVASCULAR: S1, S2 normal. No murmurs, rubs, or gallops.  ABDOMEN: Soft, non-tender, non-distended. Bowel sounds present. No organomegaly or mass.  EXTREMITIES: No pedal edema, cyanosis, or clubbing.  NEUROLOGIC: Cranial nerves II through XII are intact. No focal motor or sensory defecits b/l.  PSYCHIATRIC: The patient is alert and oriented x 3.  SKIN: No obvious rash, lesion, or ulcer. Occipital area cellulitis with slight induration and open area with no acute drainage noted.    DATA REVIEW:   CBC  Recent Labs Lab 08/21/16 0450  WBC 14.9*  HGB 12.3  HCT 37.5  PLT 265    Chemistries   Recent Labs  Lab 08/21/16 0450  NA 134*  K 3.4*  CL 106  CO2 20*  GLUCOSE 319*  BUN 8  CREATININE 0.52  CALCIUM 8.6*  AST 34  ALT 25  ALKPHOS 178*  BILITOT 0.8    Cardiac Enzymes No results for input(s): TROPONINI in the last 168 hours.  Microbiology Results  Results for orders placed or performed during the hospital encounter of 08/20/16  Urine culture     Status: Abnormal (Preliminary result)   Collection Time: 08/20/16 11:40 AM  Result Value Ref Range Status   Specimen Description URINE, CLEAN CATCH  Final   Special Requests NONE  Final   Culture (A)  Final    50,000 COLONIES/mL ESCHERICHIA COLI SUSCEPTIBILITIES TO FOLLOW Performed at [facility], [address] [place], [place] [address]    Report Status PENDING  Incomplete  CULTURE, BLOOD (ROUTINE X 2) w Reflex to ID Panel     Status: None (Preliminary result)   Collection Time: 08/20/16 11:40 AM  Result Value Ref Range Status   Specimen Description BLOOD LEFT AC  Final    Special Requests BOTTLES DRAWN AEROBIC AND ANAEROBIC BCHV  Final   Culture NO GROWTH < 24 HOURS  Final   Report Status PENDING  Incomplete  CULTURE, BLOOD (ROUTINE X 2) w Reflex to ID Panel     Status: None (Preliminary result)   Collection Time: 08/20/16 11:41 AM  Result Value Ref Range Status   Specimen Description BLOOD RIGHT AC  Final   Special Requests BOTTLES DRAWN AEROBIC AND ANAEROBIC BCAV  Final   Culture NO GROWTH < 24 HOURS  Final   Report Status PENDING  Incomplete    RADIOLOGY:  No results found.    Management plans discussed with the patient, family and they are in agreement.  CODE STATUS:     Code Status Orders        Start     Ordered   08/20/16 1339  Full code  Continuous     08/20/16 1338    Code Status History    Date Active Date Inactive Code Status Order ID Comments User Context   This patient has a current code status but no historical code status.      TOTAL TIME TAKING CARE OF THIS PATIENT: 40 minutes.    Houston SirenSAINANI,Kolette Vey J M.D on 08/21/2016 at 2:33 PM  Between 7am to 6pm - Pager - [phone]  After 6pm go to [email or web address] - Scientist, research (life sciences)password EPAS [facility]  [employer]  Hospitalists  Office  [phone]  CC: Primary care physician; No primary care provider on file.

## 2016-08-21 NOTE — Progress Notes (Signed)
Pharmacy Antibiotic Note  Brenda Mccarthy is a 41 y.o. female admitted on 08/20/2016 with Abscess on head/purulent drainage and Sepsis. Pharmacy has been consulted for Vancomycin dosing.  Hx hidradenitis suppurativa, DM.  Plan: Vancomycin 1250 IV every 12 hours.  Goal trough 15-20 mcg/mL. Vancomycin trough prior to 5th dose on 3/3.    Height: 5\' 9"  (175.3 cm) Weight: 189 lb (85.7 kg) IBW/kg (Calculated) : 66.2  Temp (24hrs), Avg:98.6 F (37 C), Min:97.8 F (36.6 C), Max:99.4 F (37.4 C)   Recent Labs Lab 08/20/16 1037 08/20/16 1140 08/21/16 0450  WBC 19.4*  --  14.9*  CREATININE 0.76  --  0.52  LATICACIDVEN  --  1.6  --     Estimated Creatinine Clearance: 107 mL/min (by C-G formula based on SCr of 0.52 mg/dL).    Allergies  Allergen Reactions  . Bee Venom Anaphylaxis  . Other     Throat swelling with bleach    Antimicrobials this admission: CTX x 1   >>   Vancomycin  3/1 >>    Dose adjustments this admission:    Microbiology results: 3/1 BCx: NGTD 3/1 UCx: pending   Thank you for allowing pharmacy to be a part of this patient's care.  Brenda Mccarthy, Brenda Mccarthy D 08/21/2016 9:49 AM

## 2016-08-21 NOTE — Progress Notes (Addendum)
Inpatient Diabetes Program Recommendations  AACE/ADA: New Consensus Statement on Inpatient Glycemic Control (2015)  Target Ranges:  Prepandial:   less than 140 mg/dL      Peak postprandial:   less than 180 mg/dL (1-2 hours)      Critically ill patients:  140 - 180 mg/dL   Results for Brenda Mccarthy, Brenda Mccarthy (MRN [id number]) as of 08/21/2016 07:38  Ref. Range 08/20/2016 10:35 08/20/2016 12:57 08/20/2016 13:52 08/20/2016 16:22 08/20/2016 21:52  Glucose-Capillary Latest Ref Range: 65 - 99 mg/dL 409435 (H) 811387 (H) 914369 (H) 293 (H) 326 (H)   Review of Glycemic Control  Diabetes history: DM2 Outpatient Diabetes medications: None listed on home med list (per pt she use to take Levemir 75 daily and Glipizide XL 10 BID but stopped due to financial issues) Current orders for Inpatient glycemic control: Levemir 10 units BID, Novolog 0-15 units TID with meals, Novolog 0-5 units QHS  Inpatient Diabetes Program Recommendations: Insulin - Basal: Please discontinue Levemir and order 70/30 18 units BID with meals (breakfast and supper) starting now. This dose of 70/30 will provide a total of 25 units for basal and 11 unit for meal coverage per day. HgbA1C: A1C in process. Outpatient Referral: Will need to establish care with PCP and follow up as outpatient regarding DM control.  NOTE: In reviewing chart, noted patient does not have any insurance nor a PCP. Will place consult for CM for assistance with medications and establishing follow up care. Plan to talk with patient today.  Addendum 08/21/16@11 :40-Spoke with patient about diabetes and home regimen for diabetes control. Patient reports that she has not taken any DM medication in about 1 year and she reports that [facility] seen about 1 year ago) and was dissatisfied with care she received there and does not plan to go back there. Patient has an old medication list that has Levemir 75 unit daily and Glipizide XL 10 mg BID listed and  she confirms that is what she was taking both as prescribed in the past for DM control.  Discussed glucose and A1C goals. Discussed importance of checking CBGs and maintaining good CBG control to prevent long-term and short-term complications. Explained how hyperglycemia leads to damage within blood vessels which lead to the common complications seen with uncontrolled diabetes. Stressed to the patient the importance of improving glycemic control to prevent further complications from uncontrolled diabetes. Patient does not currently have a PCP and will need assistance with follow up and medication assistance. Have placed consult for CM and discussed with Misty StanleyLisa, CM. Discussed Novolin insulin products (Novolin N, Novolin R, Novolin 70/30) and explained they are affordable insulins and [facility] for $25 per vial. Provided patient with handout information on Reli-On products. Would recommend using NOVOLIN 70/30 for DM control as an outpatient. Patient is interested in medication assistance clinic; have asked CM to provide application.   Patient verbalized understanding of information discussed and she states that she has no further questions at this time related to diabetes.  Thanks, Orlando PennerMarie Cherylene Ferrufino, RN, MSN, CDE Diabetes Coordinator Inpatient Diabetes Program [phone] (Team Pager from 8am to 5pm)

## 2016-08-21 NOTE — Progress Notes (Signed)
Patient is being discharged home. Family here to pick her up. IV's removed, DC and Rx instructions given and pt acknowledged understanding. Secretary tried several times to make appointment for patient but there was no response at Dr. Talmage CoinWoodham's office.  Patient said she would call today when she gets home or on Monday if she can not make appointment today. Patient insisted on walking out rather than taking a wheelchair.

## 2016-08-22 LAB — URINE CULTURE

## 2016-08-22 LAB — HEMOGLOBIN A1C
HEMOGLOBIN A1C: 13 % — AB (ref 4.8–5.6)
MEAN PLASMA GLUCOSE: 326 mg/dL

## 2016-08-25 LAB — CULTURE, BLOOD (ROUTINE X 2)
CULTURE: NO GROWTH
CULTURE: NO GROWTH

## 2016-09-21 ENCOUNTER — Ambulatory Visit: Payer: Self-pay | Admitting: Pharmacy Technician

## 2016-09-21 NOTE — Progress Notes (Signed)
Patient scheduled for eligibility appointment at [facility].  Patient did not show for the appointment on 09/21/16 at 9:00a.m.  Patient did not reschedule eligibility appointment.  [facility] will be unable to provide ongoing medication assistance until eligibility is determined.  Sherilyn Dacosta Care Manager [facility]

## 2016-11-07 ENCOUNTER — Emergency Department
Admission: EM | Admit: 2016-11-07 | Discharge: 2016-11-07 | Disposition: A | Discharge status: Home / Self Care | Payer: Self-pay | Attending: Emergency Medicine | Admitting: Emergency Medicine

## 2016-11-07 DIAGNOSIS — H00011 Hordeolum externum right upper eyelid: Secondary | ICD-10-CM | POA: Insufficient documentation

## 2016-11-07 DIAGNOSIS — B379 Candidiasis, unspecified: Secondary | ICD-10-CM | POA: Insufficient documentation

## 2016-11-07 DIAGNOSIS — Z79899 Other long term (current) drug therapy: Secondary | ICD-10-CM | POA: Insufficient documentation

## 2016-11-07 DIAGNOSIS — L02214 Cutaneous abscess of groin: Secondary | ICD-10-CM | POA: Insufficient documentation

## 2016-11-07 DIAGNOSIS — Z7982 Long term (current) use of aspirin: Secondary | ICD-10-CM | POA: Insufficient documentation

## 2016-11-07 DIAGNOSIS — Z794 Long term (current) use of insulin: Secondary | ICD-10-CM | POA: Insufficient documentation

## 2016-11-07 DIAGNOSIS — E119 Type 2 diabetes mellitus without complications: Secondary | ICD-10-CM | POA: Insufficient documentation

## 2016-11-07 DIAGNOSIS — L0291 Cutaneous abscess, unspecified: Secondary | ICD-10-CM

## 2016-11-07 LAB — URINALYSIS, COMPLETE (UACMP) WITH MICROSCOPIC
Bilirubin Urine: NEGATIVE
Ketones, ur: NEGATIVE mg/dL
Leukocytes, UA: NEGATIVE
Nitrite: NEGATIVE
PROTEIN: NEGATIVE mg/dL
SPECIFIC GRAVITY, URINE: 1.039 — AB (ref 1.005–1.030)
pH: 6 (ref 5.0–8.0)

## 2016-11-07 MED ORDER — DOXYCYCLINE MONOHYDRATE 100 MG PO CAPS: 100.0000 mg | ORAL_CAPSULE | Freq: Two times a day (BID) | ORAL | 0 refills | Status: AC

## 2016-11-07 MED ORDER — DOXYCYCLINE HYCLATE 100 MG PO TABS
100.0000 mg | ORAL_TABLET | Freq: Once | ORAL | Status: AC
  Administered 2016-11-07: 100 mg via ORAL
  Filled 2016-11-07: qty 1

## 2016-11-07 MED ORDER — FLUCONAZOLE 50 MG PO TABS
150.0000 mg | ORAL_TABLET | Freq: Once | ORAL | Status: AC
  Administered 2016-11-07: 04:00:00 150 mg via ORAL
  Filled 2016-11-07: qty 1

## 2016-11-07 MED ORDER — FLUCONAZOLE 150 MG PO TABS: ORAL_TABLET | ORAL | 0 refills | Status: DC

## 2016-11-07 NOTE — ED Triage Notes (Addendum)
Pt presents with c/o right eye pain that started about 3 days ago. Pt states trouble seeing and dryness of eye. Pt also c/o of abcess on left side of face that came up Wednesday from an ingrown hair that was plucked. Pt c/o abcess on mid pelvic area that is draining pus for 10 days. Pt states vaginal itching that has been ongoing for about a month with no relief from OTC medications.

## 2016-11-07 NOTE — Discharge Instructions (Signed)
Please read through the included information.  BE SURE TO USE WARM COMPRESSESS ON YOUR ABSCESSES TO KEEP THEM DRAINING.  Follow up with the physicians listed as recommended and as needed.    Return to the emergency department if you develop new or worsening symptoms that concern you.

## 2016-11-07 NOTE — ED Provider Notes (Signed)
[facility] Emergency Department Provider Note  ____________________________________________   First MD Initiated Contact with Patient 11/07/16 [phone]     (approximate)  I have reviewed the triage vital signs and the nursing notes.   HISTORY  Chief Complaint Eye Pain; Abscess; and Vaginal Itching    HPI Brenda Mccarthy is a 41 y.o. female whose medical history includes hydradenitis and recurrent cutaneous abscesses who presents for a variety of complaints including a draining abscess on the left side of her face, a draining abscess in her pubic region, swelling to her right upper eyelid, and vaginal itching consistent with yeast infection which is chronic.  She reports that she is supposed to be taking doxycycline all the time because of her recurrent skin infections but she ran out and has not been back to her doctor.  Her abscesses of present for about a week and been draining pus.  They have not been spreading or getting worse.  Her right eye swelling developed about 3 days ago.  Nothing in particular makes her symptoms better nor worse.  She reports that she knows she should be using warm compresses on all of them but has not been doing so.  She has minimal pain associated with them but the abscess on her face is moderate in pain.  She is not having any difficulty swallowing and denies fever/chills, chest pain, shortness of breath, nausea, vomiting, abdominal pain, dysuria.  She describes the vaginal itching as consistent with her chronic yeast infection but over-the-counter medicine is not working.   Past Medical History:  Diagnosis Date  . Diabetes mellitus without complication (HCC)   . Hydradenitis   . Migraine     Patient Active Problem List   Diagnosis Date Noted  . Sepsis (HCC) 08/20/2016  . Infected sebaceous cyst of skin     Past Surgical History:  Procedure Laterality Date  . ANTERIOR CRUCIATE LIGAMENT REPAIR      Prior to  Admission medications   Medication Sig Start Date End Date Taking? Authorizing Provider  aspirin EC 81 MG tablet Take 81 mg by mouth daily.    [provider]  aspirin-acetaminophen-caffeine (EXCEDRIN MIGRAINE) [phone] MG tablet Take by mouth every 6 (six) hours as needed for headache.    [provider]  cetirizine (ZYRTEC) 10 MG tablet Take 10 mg by mouth daily.    [provider]  doxycycline (MONODOX) 100 MG capsule Take 1 capsule (100 mg total) by mouth 2 (two) times daily. 11/07/16 11/21/16  Loleta Rose, MD  fluconazole (DIFLUCAN) 150 MG tablet Take 1 tablet (150 mg) by mouth if you still have symptoms after 48 hours. 11/07/16   Loleta Rose, MD  gabapentin (NEURONTIN) 300 MG capsule Take 300 mg by mouth daily as needed.    [provider]  insulin NPH-regular Human (NOVOLIN 70/30) (70-30) 100 UNIT/ML injection Inject 18 Units into the skin 2 (two) times daily with a meal. 08/21/16   Sainani, Rolly Pancake, MD  lisinopril (PRINIVIL,ZESTRIL) 5 MG tablet Take 5 mg by mouth daily.    [provider]  silver sulfADIAZINE (SILVADENE) 1 % cream Apply 1 application topically daily as needed.    [provider]  sodium chloride (OCEAN) 0.65 % SOLN nasal spray Place 1 spray into both nostrils as needed for congestion.    [provider]    Allergies Bee venom and Other  Family History  Problem Relation Age of Onset  . Bipolar disorder Mother   .  Diabetes Mother   . Heart disease Mother     Social History Social History  Substance Use Topics  . Smoking status: Never Smoker  . Smokeless tobacco: Never Used  . Alcohol use No    Review of Systems Constitutional: No fever/chills Eyes: No visual changes. ENT: No sore throat. Swelling and redness around the right eye Cardiovascular: Denies chest pain. Respiratory: Denies shortness of breath. Gastrointestinal: No abdominal pain.  No nausea, no vomiting.  No diarrhea.  No  constipation. Genitourinary: Negative for dysuria. Musculoskeletal: Negative for neck pain.  Negative for back pain. Integumentary: Multiple draining cutaneous abscesses as described in history of present illness Neurological: Negative for headaches, focal weakness or numbness.   ____________________________________________   PHYSICAL EXAM:  VITAL SIGNS: ED Triage Vitals [11/07/16 0011]  Enc Vitals Group     BP (!) 145/113     Pulse Rate (!) 105     Resp 18     Temp 98.3 F (36.8 C)     Temp Source Oral     SpO2 99 %     Weight 190 lb (86.2 kg)     Height 5\' 9"  (1.753 m)     Head Circumference      Peak Flow      Pain Score      Pain Loc      Pain Edu?      Excl. in GC?     Constitutional: Alert and oriented. Well appearing and in no acute distress. Eyes: Swelling and redness of the right upper eyelid consistent with stye.  No chemosis, no evidence of conjunctivitis, no evidence of orbital nor preseptal cellulitis Cardiovascular: Normal rate, regular rhythm. Good peripheral circulation. Grossly normal heart sounds. Respiratory: Normal respiratory effort.  No retractions. Lungs CTAB. Gastrointestinal: Soft and nontender. No distention.  Genitourinary: Deferred at patient preference Musculoskeletal: No lower extremity tenderness nor edema. No gross deformities of extremities. Neurologic:  Normal speech and language. No gross focal neurologic deficits are appreciated.  Skin:  The patient has a open abscess on the left lower part of her face which is actively draining pus and has several centimeters of induration with some surrounding cellulitis.  She reports a similar but smaller abscess in her pubic region but declined to have a nurse and me look at it. Psychiatric: Mood and affect are normal. Speech and behavior are normal.  ____________________________________________   LABS (all labs ordered are listed, but only abnormal results are displayed)  Labs Reviewed    URINALYSIS, COMPLETE (UACMP) WITH MICROSCOPIC - Abnormal; Notable for the following:       Result Value   Color, Urine STRAW (*)    APPearance CLEAR (*)    Specific Gravity, Urine 1.039 (*)    Glucose, UA >=500 (*)    Hgb urine dipstick LARGE (*)    All other components within normal limits   ____________________________________________  EKG  None - EKG not ordered by ED physician ____________________________________________  RADIOLOGY   No results found.  ____________________________________________   PROCEDURES  Critical Care performed: No   Procedure(s) performed:   Procedures   ____________________________________________   INITIAL IMPRESSION / ASSESSMENT AND PLAN / ED COURSE  Pertinent labs & imaging results that were available during my care of the patient were reviewed by me and considered in my medical decision making (see chart for details).  We had a lengthy discussion about the need to use warm compresses and to continue on doxycycline which has worked for her consistently  in the past.  We discussed incision and drainage of the facial abscess but expressed my concerns about the fact it is on her face and feel that it is not absolutely necessary given that it is actively draining a large amount of purulent material.  She agrees that she would prefer not to have it opened at this time.  She declined a pelvic exam for wet prep as well as declined to have the nurse and me look at the abscess, stating that it is small and that she thinks antibiotics work.  I counseled her about the importance of warm compresses on her eye and the doxycycline should help with the probable staph infection associated with the stye as well.  She will follow up as an outpatient and I gave her multiple specialists providers with whom she can follow-up including ophthalmology and ENT.  I gave my usual and customary return precautions.          ____________________________________________  FINAL CLINICAL IMPRESSION(S) / ED DIAGNOSES  Final diagnoses:  Abscess  Hordeolum externum of right upper eyelid  Yeast infection     MEDICATIONS GIVEN DURING THIS VISIT:  Medications  fluconazole (DIFLUCAN) tablet 150 mg (not administered)  doxycycline (VIBRA-TABS) tablet 100 mg (not administered)     NEW OUTPATIENT MEDICATIONS STARTED DURING THIS VISIT:  New Prescriptions   DOXYCYCLINE (MONODOX) 100 MG CAPSULE    Take 1 capsule (100 mg total) by mouth 2 (two) times daily.   FLUCONAZOLE (DIFLUCAN) 150 MG TABLET    Take 1 tablet (150 mg) by mouth if you still have symptoms after 48 hours.    Modified Medications   No medications on file    Discontinued Medications   DOXYCYCLINE (ADOXA) 100 MG TABLET    Take by mouth daily.      Note:  This document was prepared using Dragon voice recognition software and may include unintentional dictation errors.    Loleta Rose, MD 11/07/16 [phone]

## 2017-10-15 ENCOUNTER — Emergency Department
Admission: EM | Admit: 2017-10-15 | Discharge: 2017-10-15 | Disposition: A | Discharge status: Home / Self Care | Payer: Self-pay | Attending: Emergency Medicine | Admitting: Emergency Medicine

## 2017-10-15 ENCOUNTER — Encounter: Payer: Self-pay | Admitting: Emergency Medicine

## 2017-10-15 DIAGNOSIS — Z79899 Other long term (current) drug therapy: Secondary | ICD-10-CM | POA: Insufficient documentation

## 2017-10-15 DIAGNOSIS — Z794 Long term (current) use of insulin: Secondary | ICD-10-CM | POA: Insufficient documentation

## 2017-10-15 DIAGNOSIS — L0291 Cutaneous abscess, unspecified: Secondary | ICD-10-CM

## 2017-10-15 DIAGNOSIS — Z87891 Personal history of nicotine dependence: Secondary | ICD-10-CM | POA: Insufficient documentation

## 2017-10-15 DIAGNOSIS — Z7982 Long term (current) use of aspirin: Secondary | ICD-10-CM | POA: Insufficient documentation

## 2017-10-15 DIAGNOSIS — L0231 Cutaneous abscess of buttock: Secondary | ICD-10-CM | POA: Insufficient documentation

## 2017-10-15 DIAGNOSIS — E119 Type 2 diabetes mellitus without complications: Secondary | ICD-10-CM | POA: Insufficient documentation

## 2017-10-15 LAB — CBC WITH DIFFERENTIAL/PLATELET
Basophils Absolute: 0.1 10*3/uL (ref 0–0.1)
Basophils Relative: 1 %
EOS PCT: 3 %
Eosinophils Absolute: 0.4 10*3/uL (ref 0–0.7)
HEMATOCRIT: 39.5 % (ref 35.0–47.0)
Hemoglobin: 13.2 g/dL (ref 12.0–16.0)
LYMPHS ABS: 3.2 10*3/uL (ref 1.0–3.6)
LYMPHS PCT: 25 %
MCH: 28.2 pg (ref 26.0–34.0)
MCHC: 33.4 g/dL (ref 32.0–36.0)
MCV: 84.4 fL (ref 80.0–100.0)
MONO ABS: 0.5 10*3/uL (ref 0.2–0.9)
Monocytes Relative: 4 %
NEUTROS ABS: 8.8 10*3/uL — AB (ref 1.4–6.5)
Neutrophils Relative %: 67 %
PLATELETS: 275 10*3/uL (ref 150–440)
RBC: 4.69 MIL/uL (ref 3.80–5.20)
RDW: 13.5 % (ref 11.5–14.5)
WBC: 12.9 10*3/uL — ABNORMAL HIGH (ref 3.6–11.0)

## 2017-10-15 MED ORDER — HYDROCODONE-ACETAMINOPHEN 5-325 MG PO TABS: 1.0000 | ORAL_TABLET | Freq: Four times a day (QID) | ORAL | 0 refills | Status: DC | PRN

## 2017-10-15 MED ORDER — CLINDAMYCIN PHOSPHATE 600 MG/50ML IV SOLN
INTRAVENOUS | Status: AC
  Filled 2017-10-15: qty 50

## 2017-10-15 MED ORDER — FLUCONAZOLE 150 MG PO TABS: ORAL_TABLET | ORAL | 0 refills | Status: DC

## 2017-10-15 MED ORDER — ONDANSETRON HCL 4 MG/2ML IJ SOLN
4.0000 mg | Freq: Once | INTRAMUSCULAR | Status: AC
  Administered 2017-10-15: 4 mg via INTRAVENOUS
  Filled 2017-10-15: qty 2

## 2017-10-15 MED ORDER — CLINDAMYCIN PHOSPHATE 600 MG/50ML IV SOLN
600.0000 mg | Freq: Once | INTRAVENOUS | Status: AC
  Administered 2017-10-15: 600 mg via INTRAVENOUS

## 2017-10-15 MED ORDER — MORPHINE SULFATE (PF) 4 MG/ML IV SOLN
4.0000 mg | Freq: Once | INTRAVENOUS | Status: AC
Start: 2017-10-15 — End: 2017-10-15
  Administered 2017-10-15: 4 mg via INTRAVENOUS
  Filled 2017-10-15: qty 1

## 2017-10-15 MED ORDER — CLINDAMYCIN HCL 150 MG PO CAPS: 300.0000 mg | ORAL_CAPSULE | Freq: Three times a day (TID) | ORAL | 0 refills | Status: DC

## 2017-10-15 MED ORDER — LIDOCAINE HCL (PF) 1 % IJ SOLN
5.0000 mL | Freq: Once | INTRAMUSCULAR | Status: DC
  Filled 2017-10-15: qty 5

## 2017-10-15 NOTE — Discharge Instructions (Signed)
Follow-up with your regular doctor return to the emergency department if you are not better in 3 to 5 days.  If the area is worsening you will need to return to the emergency department.  Take medication as prescribed.

## 2017-10-15 NOTE — ED Triage Notes (Signed)
Patient states that she has a abscess in her vaginal area that started about a week ago and has become larger. Patient states that it is not draining.

## 2017-10-15 NOTE — ED Provider Notes (Signed)
[facility] Emergency Department Provider Note  ____________________________________________   First MD Initiated Contact with Patient 10/15/17 2051     (approximate)  I have reviewed the triage vital signs and the nursing notes.   HISTORY  Chief Complaint Abscess    HPI Brenda Mccarthy is a 42 y.o. female who presents to the emergency department complaining of abscess in the vaginal area.  She states symptoms began a week ago and it has become larger.  She is tried to drain it on her own without any success.  She states she gets multiple abscesses frequently.  She used to be on doxycycline daily but cannot afford the medication now.   she denies any fever chills, vomiting or diarrhea  Past Medical History:  Diagnosis Date  . Diabetes mellitus without complication (HCC)   . Hydradenitis   . Migraine     Patient Active Problem List   Diagnosis Date Noted  . Sepsis (HCC) 08/20/2016  . Infected sebaceous cyst of skin     Past Surgical History:  Procedure Laterality Date  . ANTERIOR CRUCIATE LIGAMENT REPAIR    . TUBAL LIGATION      Prior to Admission medications   Medication Sig Start Date End Date Taking? Authorizing Provider  aspirin EC 81 MG tablet Take 81 mg by mouth daily.    [provider]  aspirin-acetaminophen-caffeine (EXCEDRIN MIGRAINE) [phone] MG tablet Take by mouth every 6 (six) hours as needed for headache.    [provider]  cetirizine (ZYRTEC) 10 MG tablet Take 10 mg by mouth daily.    [provider]  clindamycin (CLEOCIN) 150 MG capsule Take 2 capsules (300 mg total) by mouth 3 (three) times daily. 10/15/17   Fisher, Roselyn Bering, PA-C  fluconazole (DIFLUCAN) 150 MG tablet Take one now and one in a week, repeat weekly as needed for yeast 10/15/17   Fisher, Roselyn Bering, PA-C  gabapentin (NEURONTIN) 300 MG capsule Take 300 mg by mouth daily as needed.    [provider]    HYDROcodone-acetaminophen (NORCO/VICODIN) 5-325 MG tablet Take 1 tablet by mouth every 6 (six) hours as needed for moderate pain. 10/15/17   Fisher, Roselyn Bering, PA-C  insulin NPH-regular Human (NOVOLIN 70/30) (70-30) 100 UNIT/ML injection Inject 18 Units into the skin 2 (two) times daily with a meal. 08/21/16   Sainani, Rolly Pancake, MD  lisinopril (PRINIVIL,ZESTRIL) 5 MG tablet Take 5 mg by mouth daily.    [provider]  silver sulfADIAZINE (SILVADENE) 1 % cream Apply 1 application topically daily as needed.    [provider]  sodium chloride (OCEAN) 0.65 % SOLN nasal spray Place 1 spray into both nostrils as needed for congestion.    [provider]    Allergies Bee venom and Other  Family History  Problem Relation Age of Onset  . Bipolar disorder Mother   . Diabetes Mother   . Heart disease Mother     Social History Social History   Tobacco Use  . Smoking status: Former Games developer  . Smokeless tobacco: Never Used  Substance Use Topics  . Alcohol use: Yes    Comment: occ  . Drug use: No    Review of Systems  Constitutional: No fever/chills Eyes: No visual changes. ENT: No sore throat. Respiratory: Denies cough Genitourinary: Negative for dysuria. Musculoskeletal: Negative for back pain. Skin: Negative for rash.  Positive for a large abscess near the vaginal area    ____________________________________________   PHYSICAL EXAM:  VITAL SIGNS: ED Triage Vitals  Enc Vitals Group     BP 10/15/17 2032 (!) 142/78     Pulse Rate 10/15/17 2032 97     Resp 10/15/17 2032 18     Temp 10/15/17 2032 98.1 F (36.7 C)     Temp Source 10/15/17 2032 Oral     SpO2 10/15/17 2032 100 %     Weight 10/15/17 2033 180 lb (81.6 kg)     Height 10/15/17 2033 5\' 9"  (1.753 m)     Head Circumference --      Peak Flow --      Pain Score 10/15/17 2032 8     Pain Loc --      Pain Edu? --      Excl. in GC? --     Constitutional: Alert and oriented. Well appearing and  in no acute distress. Eyes: Conjunctivae are normal.  Head: Atraumatic. Nose: No congestion/rhinnorhea. Mouth/Throat: Mucous membranes are moist.   Cardiovascular: Normal rate, regular rhythm.  Heart sounds are normal Respiratory: Normal respiratory effort.  No retractions, lungs are clear to auscultation GU:  the left labia is swollen at the distal aspect, no Bartholin cyst is noted, the abscess feels as if it sat the labia and down into the area of the buttock, there is no drainage noted the area is tender and indurated Musculoskeletal: FROM all extremities, warm and well perfused Neurologic:  Normal speech and language.  Skin:  Skin is warm, dry and intact. No rash noted.  Abscesses noted in the GU section Psychiatric: Mood and affect are normal. Speech and behavior are normal.  ____________________________________________   LABS (all labs ordered are listed, but only abnormal results are displayed)  Labs Reviewed  CBC WITH DIFFERENTIAL/PLATELET - Abnormal; Notable for the following components:      Result Value   WBC 12.9 (*)    Neutro Abs 8.8 (*)    All other components within normal limits   ____________________________________________   ____________________________________________  RADIOLOGY    ____________________________________________   PROCEDURES  Procedure(s) performed: Saline lock, CBC, morphine 4 mg IV, clindamycin 600 mg IV  .Marland Kitchen.Incision and Drainage Date/Time: 10/15/2017 11:51 PM Performed by: Faythe GheeFisher, Susan W, PA-C Authorized by: Faythe GheeFisher, Susan W, PA-C   Consent:    Consent obtained:  Verbal   Consent given by:  Patient   Risks discussed:  Bleeding, incomplete drainage, pain and infection   Alternatives discussed:  No treatment Location:    Type:  Abscess   Location:  Anogenital   Anogenital location:  Gluteal cleft Pre-procedure details:    Skin preparation:  Betadine Anesthesia (see MAR for exact dosages):    Anesthesia method:  Local  infiltration   Local anesthetic:  Lidocaine 1% w/o epi Procedure type:    Complexity:  Simple Procedure details:    Needle aspiration: no     Incision types:  Stab incision   Incision depth:  Dermal   Scalpel blade:  11   Drainage:  Bloody   Drainage amount:  Scant   Wound treatment:  Wound left open Post-procedure details:    Patient tolerance of procedure:  Tolerated well, no immediate complications      ____________________________________________   INITIAL IMPRESSION / ASSESSMENT AND PLAN / ED COURSE  Pertinent labs & imaging results that were available during my care of the patient were reviewed by me and considered in my medical decision making (see chart for details).  Patient is a 42 year old female diabetic presenting to the  emergency department for an abscess near the vaginal area.  Symptoms have been ongoing for a week.  She denies any fever chills this time.  On physical exam the area is red and indurated from the inferior area of the left labia to the buttock.  See procedure note.  Patient tolerated procedure well.  Due to there not being a lot of pus drained from the area she was given 600 mg of clindamycin IV.  Pain has been controlled with 4 mg of morphine.  Patient states she is feeling much better and more comfortable.  CBC results were discussed with the patient.  Explained that she has a slight elevation and should watch this area closely.  Due to the diabetes if she is worsening she must return to the emergency department immediately and we would consider failure on outpatient antibiotics and possible admission.  She states she understands and will return if she is worsening.  She was given a prescription for clindamycin 300 mg twice daily for 7 days and Diflucan 150 mg 1 now and 1 in 1 week repeat if needed.  She was also given a prescription for hydrocodone 5/325 #15 with no refill.  She states she understands how to use the medication.  She was discharged in  stable condition    As part of my medical decision making, I reviewed the following data within the electronic MEDICAL RECORD NUMBER Nursing notes reviewed and incorporated, Labs reviewed CBC was elevated at 12.9, Notes from prior ED visits and [place] Controlled Substance Database  ____________________________________________   FINAL CLINICAL IMPRESSION(S) / ED DIAGNOSES  Final diagnoses:  Abscess      NEW MEDICATIONS STARTED DURING THIS VISIT:  Discharge Medication List as of 10/15/2017 11:07 PM    START taking these medications   Details  clindamycin (CLEOCIN) 150 MG capsule Take 2 capsules (300 mg total) by mouth 3 (three) times daily., Starting Fri 10/15/2017, Print    HYDROcodone-acetaminophen (NORCO/VICODIN) 5-325 MG tablet Take 1 tablet by mouth every 6 (six) hours as needed for moderate pain., Starting Fri 10/15/2017, Print         Note:  This document was prepared using Dragon voice recognition software and may include unintentional dictation errors.    Faythe Ghee, PA-C 10/15/17 2354    Sharyn Creamer, MD 10/16/17 [phone]

## 2017-10-15 NOTE — ED Notes (Signed)
Reviewed discharge instructions, follow-up care, and prescriptions with patient. Patient verbalized understanding of all information reviewed. Patient stable, with no distress noted at this time.    

## 2019-05-13 ENCOUNTER — Encounter: Payer: Self-pay | Admitting: Intensive Care

## 2019-05-13 ENCOUNTER — Other Ambulatory Visit: Payer: Self-pay

## 2019-05-13 ENCOUNTER — Emergency Department
Admission: EM | Admit: 2019-05-13 | Discharge: 2019-05-13 | Disposition: A | Discharge status: Home / Self Care | Payer: Self-pay | Attending: Student in an Organized Health Care Education/Training Program | Admitting: Student in an Organized Health Care Education/Training Program

## 2019-05-13 DIAGNOSIS — L02212 Cutaneous abscess of back [any part, except buttock]: Secondary | ICD-10-CM | POA: Insufficient documentation

## 2019-05-13 DIAGNOSIS — Z79899 Other long term (current) drug therapy: Secondary | ICD-10-CM | POA: Insufficient documentation

## 2019-05-13 DIAGNOSIS — Z7982 Long term (current) use of aspirin: Secondary | ICD-10-CM | POA: Insufficient documentation

## 2019-05-13 DIAGNOSIS — E119 Type 2 diabetes mellitus without complications: Secondary | ICD-10-CM | POA: Insufficient documentation

## 2019-05-13 DIAGNOSIS — Z87891 Personal history of nicotine dependence: Secondary | ICD-10-CM | POA: Insufficient documentation

## 2019-05-13 DIAGNOSIS — L0291 Cutaneous abscess, unspecified: Secondary | ICD-10-CM

## 2019-05-13 HISTORY — DX: Type 2 diabetes mellitus with diabetic neuropathy, unspecified: E11.40

## 2019-05-13 LAB — BASIC METABOLIC PANEL
Anion gap: 14 (ref 5–15)
BUN: 7 mg/dL (ref 6–20)
CO2: 25 mmol/L (ref 22–32)
Calcium: 9.4 mg/dL (ref 8.9–10.3)
Chloride: 98 mmol/L (ref 98–111)
Creatinine, Ser: 0.51 mg/dL (ref 0.44–1.00)
GFR calc Af Amer: >60 mL/min (ref >60)
GFR calc non Af Amer: >60 mL/min (ref >60)
Glucose, Bld: 330 mg/dL — ABNORMAL HIGH (ref 70–99)
Potassium: 3.6 mmol/L (ref 3.5–5.1)
Sodium: 137 mmol/L (ref 135–145)

## 2019-05-13 LAB — CBC WITH DIFFERENTIAL/PLATELET
Abs Immature Granulocytes: 0.08 10*3/uL — ABNORMAL HIGH (ref 0.00–0.07)
Basophils Absolute: 0.1 10*3/uL (ref 0.0–0.1)
Basophils Relative: 1 %
Eosinophils Absolute: 0.2 10*3/uL (ref 0.0–0.5)
Eosinophils Relative: 2 %
HCT: 39.5 % (ref 36.0–46.0)
Hemoglobin: 13.5 g/dL (ref 12.0–15.0)
Immature Granulocytes: 1 %
Lymphocytes Relative: 14 %
Lymphs Abs: 2.1 10*3/uL (ref 0.7–4.0)
MCH: 27.6 pg (ref 26.0–34.0)
MCHC: 34.2 g/dL (ref 30.0–36.0)
MCV: 80.8 fL (ref 80.0–100.0)
Monocytes Absolute: 0.7 10*3/uL (ref 0.1–1.0)
Monocytes Relative: 5 %
Neutro Abs: 11.8 10*3/uL — ABNORMAL HIGH (ref 1.7–7.7)
Neutrophils Relative %: 77 %
Platelets: 322 10*3/uL (ref 150–400)
RBC: 4.89 MIL/uL (ref 3.87–5.11)
RDW: 12.3 % (ref 11.5–15.5)
WBC: 14.9 10*3/uL — ABNORMAL HIGH (ref 4.0–10.5)
nRBC: 0 % (ref 0.0–0.2)

## 2019-05-13 MED ORDER — CLINDAMYCIN HCL 300 MG PO CAPS: 300.0000 mg | ORAL_CAPSULE | Freq: Three times a day (TID) | ORAL | 0 refills | Status: AC

## 2019-05-13 MED ORDER — LIDOCAINE 5 % EX PTCH
1.0000 | MEDICATED_PATCH | CUTANEOUS | Status: DC
  Administered 2019-05-13: 1 via TRANSDERMAL
  Filled 2019-05-13: qty 1

## 2019-05-13 MED ORDER — OXYCODONE-ACETAMINOPHEN 7.5-325 MG PO TABS: 1.0000 | ORAL_TABLET | Freq: Four times a day (QID) | ORAL | 0 refills | Status: DC | PRN

## 2019-05-13 MED ORDER — CLINDAMYCIN HCL 300 MG PO CAPS: 300.0000 mg | ORAL_CAPSULE | Freq: Three times a day (TID) | ORAL | 0 refills | Status: DC

## 2019-05-13 MED ORDER — HYDROMORPHONE HCL 1 MG/ML IJ SOLN
1.0000 mg | Freq: Once | INTRAMUSCULAR | Status: AC
  Administered 2019-05-13: 1 mg via INTRAVENOUS
  Filled 2019-05-13: qty 1

## 2019-05-13 MED ORDER — CLINDAMYCIN PHOSPHATE 600 MG/50ML IV SOLN
600.0000 mg | Freq: Once | INTRAVENOUS | Status: AC
  Administered 2019-05-13: 600 mg via INTRAVENOUS
  Filled 2019-05-13: qty 50

## 2019-05-13 NOTE — ED Notes (Signed)
Pt presents to the ED with an cyst on the base of her neck. Large and redness noted.  Pt states that she is having sharp, stabbing pain at the site of the cyst. 10/10. Pt has no other complaints at this time.

## 2019-05-13 NOTE — ED Triage Notes (Signed)
Patient presents with cysts on upper back. Patient reports she gets cysts often.

## 2019-05-13 NOTE — Discharge Instructions (Addendum)
Your abscess did not require incision and drainage today.  Follow discharge care instruction take medication as directed.  Return to ED if condition worsens or you notice drainage from the lesion.

## 2019-05-13 NOTE — ED Provider Notes (Signed)
[facility] Emergency Department Provider Note   ____________________________________________   First MD Initiated Contact with Patient 05/13/19 1051     (approximate)  I have reviewed the triage vital signs and the nursing notes.   HISTORY  Chief Complaint Cyst    HPI Brenda Mccarthy is a 43 y.o. female patient complain nodular lesion on erythematous base upper back for week and a half.  Patient stated no drainage.  States a clothing rubs against the area increases her pain complaint.  Patient unsure of fever.  Patient has a history recurrent staph infections.  Patient rates her pain as a 10/10.  Patient described the pain as "sore".  No palliative measure for complaint.         Past Medical History:  Diagnosis Date  . Diabetes mellitus without complication (HCC)   . Diabetic neuropathy (HCC)   . Hydradenitis   . Migraine     Patient Active Problem List   Diagnosis Date Noted  . Sepsis (HCC) 08/20/2016  . Infected sebaceous cyst of skin     Past Surgical History:  Procedure Laterality Date  . ANTERIOR CRUCIATE LIGAMENT REPAIR    . TUBAL LIGATION      Prior to Admission medications   Medication Sig Start Date End Date Taking? Authorizing Provider  aspirin EC 81 MG tablet Take 81 mg by mouth daily.    [provider]  aspirin-acetaminophen-caffeine (EXCEDRIN MIGRAINE) [phone] MG tablet Take by mouth every 6 (six) hours as needed for headache.    [provider]  cetirizine (ZYRTEC) 10 MG tablet Take 10 mg by mouth daily.    [provider]  clindamycin (CLEOCIN) 150 MG capsule Take 2 capsules (300 mg total) by mouth 3 (three) times daily. 10/15/17   Fisher, Roselyn BeringSusan W, PA-C  clindamycin (CLEOCIN) 300 MG capsule Take 1 capsule (300 mg total) by mouth 3 (three) times daily for 10 days. 05/13/19 05/23/19  Joni ReiningSmith, Ronald K, PA-C  fluconazole (DIFLUCAN) 150 MG tablet Take one now and one in a week, repeat  weekly as needed for yeast 10/15/17   Fisher, Roselyn BeringSusan W, PA-C  gabapentin (NEURONTIN) 300 MG capsule Take 300 mg by mouth daily as needed.    [provider]  insulin NPH-regular Human (NOVOLIN 70/30) (70-30) 100 UNIT/ML injection Inject 18 Units into the skin 2 (two) times daily with a meal. 08/21/16   Sainani, Rolly PancakeVivek J, MD  lisinopril (PRINIVIL,ZESTRIL) 5 MG tablet Take 5 mg by mouth daily.    [provider]  oxyCODONE-acetaminophen (PERCOCET) 7.5-325 MG tablet Take 1 tablet by mouth every 6 (six) hours as needed. 05/13/19   Joni ReiningSmith, Ronald K, PA-C  silver sulfADIAZINE (SILVADENE) 1 % cream Apply 1 application topically daily as needed.    [provider]  sodium chloride (OCEAN) 0.65 % SOLN nasal spray Place 1 spray into both nostrils as needed for congestion.    [provider]    Allergies Bee venom and Other  Family History  Problem Relation Age of Onset  . Bipolar disorder Mother   . Diabetes Mother   . Heart disease Mother     Social History Social History   Tobacco Use  . Smoking status: Former Games developermoker  . Smokeless tobacco: Never Used  Substance Use Topics  . Alcohol use: Yes    Comment: occ  . Drug use: Yes    Types: Marijuana    Review of Systems  Constitutional: No fever/chills Eyes: No visual changes. ENT:  No sore throat. Cardiovascular: Denies chest pain. Respiratory: Denies shortness of breath. Gastrointestinal: No abdominal pain.  No nausea, no vomiting.  No diarrhea.  No constipation. Genitourinary: Negative for dysuria. Musculoskeletal: Negative for back pain. Skin: Negative for rash.  Nodule lesion right upper back. Neurological: Negative for headaches, focal weakness or numbness. Diabetic neuropathy.   Endocrine:  Diabetes. Allergic/Immunilogical: Bee stings. ____________________________________________   PHYSICAL EXAM:  VITAL SIGNS: ED Triage Vitals  Enc Vitals Group     BP 05/13/19 1034 (!) 149/79     Pulse Rate  05/13/19 1034 (!) 112     Resp 05/13/19 1034 18     Temp 05/13/19 1034 99.1 F (37.3 C)     Temp Source 05/13/19 1034 [place]     SpO2 05/13/19 1034 98 %     Weight 05/13/19 1034 180 lb (81.6 kg)     Height 05/13/19 1034 5\' 9"  (1.753 m)     Head Circumference --      Peak Flow --      Pain Score 05/13/19 1042 10     Pain Loc --      Pain Edu? --      Excl. in [place]? --    Constitutional: Alert and oriented.  Moderate distress  Cardiovascular: Tachycardic, regular rhythm. Grossly normal heart sounds.  Good peripheral circulation. Respiratory: Normal respiratory effort.  No retractions. Lungs CTAB. Gastrointestinal: Soft and nontender. No distention. No abdominal bruits. No CVA tenderness. Genitourinary: Deferred Musculoskeletal: No lower extremity tenderness nor edema.  No joint effusions. Neurologic:  Normal speech and language. No gross focal neurologic deficits are appreciated. No gait instability. Skin:  Skin is warm, dry and intact.  Nodule lesion on erythematous base right upper back. Psychiatric: Mood and affect are normal. Speech and behavior are normal.  ____________________________________________   LABS (all labs ordered are listed, but only abnormal results are displayed)  Labs Reviewed  BASIC METABOLIC PANEL - Abnormal; Notable for the following components:      Result Value   Glucose, Bld 330 (*)    All other components within normal limits  CBC WITH DIFFERENTIAL/PLATELET - Abnormal; Notable for the following components:   WBC 14.9 (*)    Neutro Abs 11.8 (*)    Abs Immature Granulocytes 0.08 (*)    All other components within normal limits   ____________________________________________  EKG   ____________________________________________  RADIOLOGY  ED MD interpretation:    Official radiology report(s): No results found.  ____________________________________________   PROCEDURES  Procedure(s) performed (including Critical Care):  Procedures    ____________________________________________   INITIAL IMPRESSION / ASSESSMENT AND PLAN / ED COURSE  As part of my medical decision making, I reviewed the following data within the [place]     Patient presents with a nonfluctuant nodule lesion upper right back.  Discussed with patient rationale for not incisING and draining at this time.  Patient had IV clindamycin and will be switched to oral medication upon discharge.  Patient given discharge care instruction advised to return to ED if wound ruptures.    Brenda Mccarthy was evaluated in Emergency Department on 05/13/2019 for the symptoms described in the history of present illness. She was evaluated in the context of the global COVID-19 pandemic, which necessitated consideration that the patient might be at risk for infection with the SARS-CoV-2 virus that causes COVID-19. Institutional protocols and algorithms that pertain to the evaluation of patients at risk for COVID-19 are in a state of rapid change  based on information released by regulatory bodies including the CDC and federal and state organizations. These policies and algorithms were followed during the patient's care in the ED.        ____________________________________________   FINAL CLINICAL IMPRESSION(S) / ED DIAGNOSES  Final diagnoses:  Abscess     ED Discharge Orders         Ordered    clindamycin (CLEOCIN) 300 MG capsule  3 times daily     05/13/19 1233    oxyCODONE-acetaminophen (PERCOCET) 7.5-325 MG tablet  Every 6 hours PRN     05/13/19 1233           Note:  This document was prepared using Dragon voice recognition software and may include unintentional dictation errors.    Joni Reining, PA-C 05/13/19 1240    Willy Eddy, MD 05/13/19 [phone]

## 2019-09-05 ENCOUNTER — Telehealth: Payer: Self-pay | Admitting: Pharmacy Technician

## 2019-09-05 NOTE — Telephone Encounter (Signed)
Attempted to reach patient via telephone.  Patient unavailable.  Left a message.  Mailing patient a new patient packet to obtain  [facility] services.  [facility] must receive requested financial documentation in order to determine eligibility and provide additional medication assistance.  Sherilyn Dacosta Care Manager [facility]

## 2020-06-17 ENCOUNTER — Telehealth: Payer: Self-pay | Admitting: Pharmacy Technician

## 2020-06-17 NOTE — Telephone Encounter (Signed)
Patient failed to provide 2021 proof of income.  No additional medication assistance will be provided by [facility] without the required proof of income documentation.  Patient notified by letter.  Sherilyn Dacosta Care Manager [facility]   [address], [place]  [address]  June 16, 2020    Dara Hoyer [address] [place], [place]  [address]  Dear Lanora Manis,  This is to inform you that you are no longer eligible to receive medication assistance at [facility].  The reason(s) are:    _____Your total gross monthly household income exceeds 250% of the Federal Poverty Level.   _____Tangible assets (savings, checking, stocks/bonds, pension, retirement, etc.) exceeds our limit  _____You are eligible to receive benefits from [facility], [facility] or HIV Medication              Assistance Program _____You are eligible to receive benefits from a Medicare Part "D" plan _____You have prescription insurance  _____You are not an [facility] resident __X__Failure to provide all requested proof of income information for 2021.    Medication assistance will resume once all requested financial information has been returned to our clinic.  If you have questions, please contact our clinic at [phone].    Thank you,  [facility]

## 2020-09-03 ENCOUNTER — Emergency Department: Payer: Self-pay

## 2020-09-03 ENCOUNTER — Emergency Department
Admission: EM | Admit: 2020-09-03 | Discharge: 2020-09-03 | Disposition: A | Discharge status: Home / Self Care | Payer: Self-pay | Attending: Emergency Medicine | Admitting: Emergency Medicine

## 2020-09-03 ENCOUNTER — Other Ambulatory Visit: Payer: Self-pay

## 2020-09-03 ENCOUNTER — Encounter: Payer: Self-pay | Admitting: Emergency Medicine

## 2020-09-03 DIAGNOSIS — Z87891 Personal history of nicotine dependence: Secondary | ICD-10-CM | POA: Insufficient documentation

## 2020-09-03 DIAGNOSIS — Z7982 Long term (current) use of aspirin: Secondary | ICD-10-CM | POA: Insufficient documentation

## 2020-09-03 DIAGNOSIS — Z794 Long term (current) use of insulin: Secondary | ICD-10-CM | POA: Insufficient documentation

## 2020-09-03 DIAGNOSIS — E1165 Type 2 diabetes mellitus with hyperglycemia: Secondary | ICD-10-CM

## 2020-09-03 DIAGNOSIS — E1140 Type 2 diabetes mellitus with diabetic neuropathy, unspecified: Secondary | ICD-10-CM | POA: Insufficient documentation

## 2020-09-03 DIAGNOSIS — R112 Nausea with vomiting, unspecified: Secondary | ICD-10-CM | POA: Insufficient documentation

## 2020-09-03 DIAGNOSIS — L97529 Non-pressure chronic ulcer of other part of left foot with unspecified severity: Secondary | ICD-10-CM | POA: Insufficient documentation

## 2020-09-03 DIAGNOSIS — E11621 Type 2 diabetes mellitus with foot ulcer: Secondary | ICD-10-CM | POA: Insufficient documentation

## 2020-09-03 LAB — CBC WITH DIFFERENTIAL/PLATELET
Abs Immature Granulocytes: 0.04 10*3/uL (ref 0.00–0.07)
Basophils Absolute: 0.1 10*3/uL (ref 0.0–0.1)
Basophils Relative: 1 %
Eosinophils Absolute: 0.3 10*3/uL (ref 0.0–0.5)
Eosinophils Relative: 3 %
HCT: 38.1 % (ref 36.0–46.0)
Hemoglobin: 12.8 g/dL (ref 12.0–15.0)
Immature Granulocytes: 0 %
Lymphocytes Relative: 22 %
Lymphs Abs: 2.2 10*3/uL (ref 0.7–4.0)
MCH: 28.1 pg (ref 26.0–34.0)
MCHC: 33.6 g/dL (ref 30.0–36.0)
MCV: 83.7 fL (ref 80.0–100.0)
Monocytes Absolute: 0.5 10*3/uL (ref 0.1–1.0)
Monocytes Relative: 5 %
Neutro Abs: 7 10*3/uL (ref 1.7–7.7)
Neutrophils Relative %: 69 %
Platelets: 314 10*3/uL (ref 150–400)
RBC: 4.55 MIL/uL (ref 3.87–5.11)
RDW: 12.6 % (ref 11.5–15.5)
WBC: 10.1 10*3/uL (ref 4.0–10.5)
nRBC: 0 % (ref 0.0–0.2)

## 2020-09-03 LAB — COMPREHENSIVE METABOLIC PANEL
ALT: 18 U/L (ref 0–44)
AST: 15 U/L (ref 15–41)
Albumin: 3.7 g/dL (ref 3.5–5.0)
Alkaline Phosphatase: 127 U/L — ABNORMAL HIGH (ref 38–126)
Anion gap: 8 (ref 5–15)
BUN: 8 mg/dL (ref 6–20)
CO2: 24 mmol/L (ref 22–32)
Calcium: 9 mg/dL (ref 8.9–10.3)
Chloride: 101 mmol/L (ref 98–111)
Creatinine, Ser: 0.61 mg/dL (ref 0.44–1.00)
GFR, Estimated: >60 mL/min (ref >60)
Glucose, Bld: 334 mg/dL — ABNORMAL HIGH (ref 70–99)
Potassium: 3.6 mmol/L (ref 3.5–5.1)
Sodium: 133 mmol/L — ABNORMAL LOW (ref 135–145)
Total Bilirubin: 0.6 mg/dL (ref 0.3–1.2)
Total Protein: 8.2 g/dL — ABNORMAL HIGH (ref 6.5–8.1)

## 2020-09-03 LAB — LACTIC ACID, PLASMA: Lactic Acid, Venous: 1 mmol/L (ref 0.5–1.9)

## 2020-09-03 MED ORDER — GLIPIZIDE 5 MG PO TABS: 5.0000 mg | ORAL_TABLET | Freq: Every day | ORAL | 0 refills | Status: DC

## 2020-09-03 MED ORDER — SULFAMETHOXAZOLE-TRIMETHOPRIM 800-160 MG PO TABS
1.0000 | ORAL_TABLET | Freq: Once | ORAL | Status: AC
  Administered 2020-09-03: 1 via ORAL
  Filled 2020-09-03: qty 1

## 2020-09-03 MED ORDER — ONDANSETRON 4 MG PO TBDP: 4.0000 mg | ORAL_TABLET | Freq: Three times a day (TID) | ORAL | 0 refills | Status: DC | PRN

## 2020-09-03 MED ORDER — DULOXETINE HCL 60 MG PO CPEP: 60.0000 mg | ORAL_CAPSULE | Freq: Every day | ORAL | 0 refills | Status: DC

## 2020-09-03 MED ORDER — SULFAMETHOXAZOLE-TRIMETHOPRIM 800-160 MG PO TABS: 1.0000 | ORAL_TABLET | Freq: Two times a day (BID) | ORAL | 0 refills | Status: AC

## 2020-09-03 MED ORDER — OXYCODONE HCL 5 MG PO TABS: 5.0000 mg | ORAL_TABLET | Freq: Three times a day (TID) | ORAL | 0 refills | Status: AC | PRN

## 2020-09-03 MED ORDER — ONDANSETRON 4 MG PO TBDP
4.0000 mg | ORAL_TABLET | Freq: Once | ORAL | Status: AC
  Administered 2020-09-03: 4 mg via ORAL
  Filled 2020-09-03: qty 1

## 2020-09-03 MED ORDER — LISINOPRIL 5 MG PO TABS: 5.0000 mg | ORAL_TABLET | Freq: Every day | ORAL | 0 refills | Status: DC

## 2020-09-03 MED ORDER — OXYCODONE HCL 5 MG PO TABS
5.0000 mg | ORAL_TABLET | Freq: Once | ORAL | Status: AC
  Administered 2020-09-03: 5 mg via ORAL
  Filled 2020-09-03: qty 1

## 2020-09-03 NOTE — ED Notes (Signed)
Rainbow and 1 set of blood cultures sent to lab at this time. ?

## 2020-09-03 NOTE — ED Triage Notes (Addendum)
Pt via POV from home. Pt noticed a blister on her L foot on Sunday, pt states the blister busted on Sunday night, pt states her L foot has also been painful since the blister busted. Pt has a hx of diabetes. Pt states that she also had been nauseous. Pt has a hx of colonizing staph skin infection. Pt is A&Ox4 and NAD.   Wound is yellow and pus pocket noted on the bandage that was taken off. Wound is foul-smelling, Redressed with gauze and wrap.

## 2020-09-03 NOTE — ED Notes (Signed)
X-ray at bedside

## 2020-09-03 NOTE — Discharge Instructions (Addendum)
Please call the podiatrist to make a follow-up appointment for your ulcer.  Take the antibiotics.  Return to ER if you develop worsening redness, blackness to the skin or any other concerns.  Take the oxycodone for pain and the Zofran to help with nausea.    Take oxycodone as prescribed. Do not drink alcohol, drive or participate in any other potentially dangerous activities while taking this medication as it may make you sleepy. Do not take this medication with any other sedating medications, either prescription or over-the-counter. If you were prescribed Percocet or Vicodin, do not take these with acetaminophen (Tylenol) as it is already contained within these medications.  This medication is an opiate (or narcotic) pain medication and can be habit forming. Use it as little as possible to achieve adequate pain control. Do not use or use it with extreme caution if you have a history of opiate abuse or dependence. If you are on a pain contract with your primary care doctor or a pain specialist, be sure to let them know you were prescribed this medication today from the [facility]. This medication is intended for your use only - do not give any to anyone else and keep it in a secure place where nobody else, especially children, have access to it.

## 2020-09-03 NOTE — ED Provider Notes (Addendum)
[facility] Emergency Department Provider Note  ____________________________________________   Event Date/Time   First MD Initiated Contact with Patient 09/03/20 1237     (approximate)  I have reviewed the triage vital signs and the nursing notes.   HISTORY  Chief Complaint Wound Check    HPI Brenda Mccarthy is a 45 y.o. 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  Patient states that she noticed a blister on her foot on Sunday.  The bottom of her left foot.  She then thought it busted.  She states that she was able to drain out some white drainage from it.  She does have some pain around it that is constant, nothing makes it better, worse with walking.  She has been doing a good job of cleaning it and keeping it dressed.  She does report some nausea and vomiting associated with it and feeling hot.  Patient states that she is usually on antibiotics constantly to prevent infections but she had difficulty getting a refill and has not been on the antibiotics since December.  On review of records it appears that patient's been on doxy, Bactrim, clindamycin previously for these infections.  Patient states that the clindamycin causes a lot of GI upset.          Past Medical History:  Diagnosis Date  . Diabetes mellitus without complication (HCC)   . Diabetic neuropathy (HCC)   . Hydradenitis   . Migraine     Patient Active Problem List   Diagnosis Date Noted  . Sepsis (HCC) 08/20/2016  . Infected sebaceous cyst of skin     Past Surgical History:  Procedure Laterality Date  . ANTERIOR CRUCIATE LIGAMENT REPAIR    . TUBAL LIGATION      Prior to Admission medications   Medication Sig Start Date End Date Taking? Authorizing Provider  aspirin EC 81 MG tablet Take 81 mg by mouth daily.    [provider]  aspirin-acetaminophen-caffeine (EXCEDRIN MIGRAINE) [phone] MG  tablet Take by mouth every 6 (six) hours as needed for headache.    [provider]  cetirizine (ZYRTEC) 10 MG tablet Take 10 mg by mouth daily.    [provider]  clindamycin (CLEOCIN) 150 MG capsule Take 2 capsules (300 mg total) by mouth 3 (three) times daily. 10/15/17   Fisher, Roselyn Bering, PA-C  fluconazole (DIFLUCAN) 150 MG tablet Take one now and one in a week, repeat weekly as needed for yeast 10/15/17   Fisher, Roselyn Bering, PA-C  gabapentin (NEURONTIN) 300 MG capsule Take 300 mg by mouth daily as needed.    [provider]  insulin NPH-regular Human (NOVOLIN 70/30) (70-30) 100 UNIT/ML injection Inject 18 Units into the skin 2 (two) times daily with a meal. 08/21/16   Sainani, Rolly Pancake, MD  lisinopril (PRINIVIL,ZESTRIL) 5 MG tablet Take 5 mg by mouth daily.    [provider]  oxyCODONE-acetaminophen (PERCOCET) 7.5-325 MG tablet Take 1 tablet by mouth every 6 (six) hours as needed. 05/13/19   Joni Reining, PA-C  silver sulfADIAZINE (SILVADENE) 1 % cream Apply 1 application topically daily as needed.    [provider]  sodium chloride (OCEAN) 0.65 % SOLN nasal spray Place 1 spray into both nostrils as needed for congestion.    [provider]    Allergies Bee venom and Other  Family History  Problem Relation Age of Onset  . Bipolar  disorder Mother   . Diabetes Mother   . Heart disease Mother     Social History Social History   Tobacco Use  . Smoking status: Former Games developer  . Smokeless tobacco: Never Used  Substance Use Topics  . Alcohol use: Yes    Comment: occ  . Drug use: Yes    Types: Marijuana      Review of Systems Constitutional: No fever/chills Eyes: No visual changes. ENT: No sore throat. Cardiovascular: Denies chest pain. Respiratory: Denies shortness of breath. Gastrointestinal: No abdominal pain.  Positive nausea, vomiting no diarrhea.  No constipation. Genitourinary: Negative for dysuria. Musculoskeletal:  Negative for back pain.  Wound on the bottom of the left foot Skin: Negative for rash. Neurological: Negative for headaches, focal weakness or numbness. All other ROS negative ____________________________________________   PHYSICAL EXAM:  VITAL SIGNS: ED Triage Vitals  Enc Vitals Group     BP 09/03/20 1211 118/88     Pulse Rate 09/03/20 1211 97     Resp 09/03/20 1211 18     Temp 09/03/20 1211 98.3 F (36.8 C)     Temp Source 09/03/20 1211 [place]     SpO2 09/03/20 1211 99 %     Weight 09/03/20 1220 179 lb (81.2 kg)     Height 09/03/20 1220 5\' 9"  (1.753 m)     Head Circumference --      Peak Flow --      Pain Score 09/03/20 1220 5     Pain Loc --      Pain Edu? --      Excl. in [place]? --     Constitutional: Alert and oriented. Well appearing and in no acute distress. Eyes: Conjunctivae are normal. EOMI. Head: Atraumatic. Nose: No congestion/rhinnorhea. Mouth/Throat: Mucous membranes are moist.   Neck: No stridor. Trachea Midline. FROM Cardiovascular: Normal rate, regular rhythm. Grossly normal heart sounds.  Good peripheral circulation. Respiratory: Normal respiratory effort.  No retractions. Lungs CTAB. Gastrointestinal: Soft and nontender. No distention. No abdominal bruits.  Musculoskeletal: No lower extremity tenderness nor edema.  No joint effusions.  Golf ball size wound on the bottom of the left foot with small open hole with no active drainage at this time.  Tenderness around the wound with a little bit of swelling but no overt erythema.  Foot is warm and well-perfused.  No erythema streaking up onto the toes or onto the foot.  No necrosis or crepitus. Neurologic:  Normal speech and language. No gross focal neurologic deficits are appreciated.  Skin:  Skin is warm, dry and intact. No rash noted. Psychiatric: Mood and affect are normal. Speech and behavior are normal. GU: Deferred   ____________________________________________   LABS (all labs ordered are listed, but  only abnormal results are displayed)  Labs Reviewed  COMPREHENSIVE METABOLIC PANEL - Abnormal; Notable for the following components:      Result Value   Sodium 133 (*)    Glucose, Bld 334 (*)    Total Protein 8.2 (*)    Alkaline Phosphatase 127 (*)    All other components within normal limits  LACTIC ACID, PLASMA  CBC WITH DIFFERENTIAL/PLATELET  LACTIC ACID, PLASMA   ____________________________________________     RADIOLOGY 09/05/20, personally viewed and evaluated these images (plain radiographs) as part of my medical decision making, as well as reviewing the written report by the radiologist.  ED MD interpretation: Soft tissue swelling but no evidence of destruction of the bone to suggest osteomyelitis  Official  radiology report(s): DG Foot Complete Left  Result Date: 09/03/2020 CLINICAL DATA:  Foot ulcer, diabetes mellitus EXAM: LEFT FOOT - COMPLETE 3+ VIEW COMPARISON:  None FINDINGS: Soft tissue swelling and soft tissue gas lateral to the fifth MTP joint with soft tissue swelling extending to dorsum of foot. Osseous mineralization normal. Joint spaces preserved. No definite fracture, dislocation, or bone destruction. Small to moderate-sized plantar calcaneal spur. IMPRESSION: Soft tissue swelling with gas lateral to LEFT fifth MTP joint. No acute osseous abnormalities. Plantar calcaneal spur. Electronically Signed   By: Ulyses Southward M.D.   On: 09/03/2020 13:47    ____________________________________________   PROCEDURES  Procedure(s) performed (including Critical Care):  Procedures   ____________________________________________   INITIAL IMPRESSION / ASSESSMENT AND PLAN / ED COURSE  Ellianne Gowen Zidek was evaluated in Emergency Department on 09/03/2020 for the symptoms described in the history of present illness. She was evaluated in the context of the global COVID-19 pandemic, which necessitated consideration that the patient might be at risk for  infection with the SARS-CoV-2 virus that causes COVID-19. Institutional protocols and algorithms that pertain to the evaluation of patients at risk for COVID-19 are in a state of rapid change based on information released by regulatory bodies including the CDC and federal and state organizations. These policies and algorithms were followed during the patient's care in the ED.     Patient is a 45 year old with recurrent abscesses who comes in with concern for ulcer on the bottom of her foot that recently drained.  Patient does have a small hole but no active drainage at this time.  Some swelling and pain around the ulcer no severe redness noted.  No signs of ischemic foot on exam.   Labs are reassuring.  No signs of severe infection.  X-ray does show soft tissue swelling with a little bit of gas lateral to the left MTP joint which is consistent with where patient had a hole and had the active drainage from the abscess which I suspect is why there is gas there.  I do not think that this represents necrotizing fasciitis or other severe infection at this time.   Given patient's prior MRSA infection I think it be best to treat this with Bactrim.  We will give a dose of oxycodone and Zofran for pain and nausea as well.  Will get x-ray to make sure no deeper infection such as osteomyelitis but seems less likely on exam.  Labs ordered to evaluate for sepsis, bacteremia.  White count was normal and other than her heart rate being over 90 she does not meet sepsis criteria.  Lactate was normal.  We will give patient some a course of Bactrim and have her follow-up with podiatry.  Patient's glucose is slightly elevated but no signs of DKA.  Patient has been some of her medications and I have written a prescription for some of her home medications for 2 weeks until she can get follow-up with her PCP  We discussed precautions for oxycodone including not driving or working while on this and not taking other sedating  medicine.  The database was also reviewed  We discussed return precautions in regards to the wound and patient expressed understanding      ____________________________________________   FINAL CLINICAL IMPRESSION(S) / ED DIAGNOSES   Final diagnoses:  Ulcer of left foot, unspecified ulcer stage (HCC)      MEDICATIONS GIVEN DURING THIS VISIT:  Medications  ondansetron (ZOFRAN-ODT) disintegrating tablet 4 mg (4 mg [place] Given  09/03/20 1255)  oxyCODONE (Oxy IR/ROXICODONE) immediate release tablet 5 mg (5 mg Oral Given 09/03/20 1254)  sulfamethoxazole-trimethoprim (BACTRIM DS) 800-160 MG per tablet 1 tablet (1 tablet [place] Given 09/03/20 1254)     ED Discharge Orders         Ordered    lisinopril (ZESTRIL) 5 MG tablet  Daily        09/03/20 1419    glipiZIDE (GLUCOTROL) 5 MG tablet  Daily before breakfast        09/03/20 1419    DULoxetine (CYMBALTA) 60 MG capsule  Daily        09/03/20 1419    oxyCODONE (ROXICODONE) 5 MG immediate release tablet  Every 8 hours PRN        09/03/20 1425    sulfamethoxazole-trimethoprim (BACTRIM DS) 800-160 MG tablet  2 times daily        09/03/20 1425    ondansetron (ZOFRAN ODT) 4 MG disintegrating tablet  Every 8 hours PRN        09/03/20 1425           Note:  This document was prepared using Dragon voice recognition software and may include unintentional dictation errors.   Concha SeFunke, Chayse Gracey E, MD 09/03/20 1426    Concha SeFunke, Mixtli Reno E, MD 09/03/20 [id number]

## 2020-09-03 NOTE — ED Notes (Signed)
Went to d/c pt and pt was dry heaving- will let Dr Fuller Plan know

## 2020-11-10 ENCOUNTER — Emergency Department
Admission: EM | Admit: 2020-11-10 | Discharge: 2020-11-10 | Disposition: A | Discharge status: Home / Self Care | Payer: Self-pay | Attending: Emergency Medicine | Admitting: Emergency Medicine

## 2020-11-10 ENCOUNTER — Other Ambulatory Visit: Payer: Self-pay

## 2020-11-10 ENCOUNTER — Emergency Department: Payer: Self-pay

## 2020-11-10 DIAGNOSIS — Z794 Long term (current) use of insulin: Secondary | ICD-10-CM | POA: Insufficient documentation

## 2020-11-10 DIAGNOSIS — Z87891 Personal history of nicotine dependence: Secondary | ICD-10-CM | POA: Insufficient documentation

## 2020-11-10 DIAGNOSIS — D72829 Elevated white blood cell count, unspecified: Secondary | ICD-10-CM | POA: Insufficient documentation

## 2020-11-10 DIAGNOSIS — E1140 Type 2 diabetes mellitus with diabetic neuropathy, unspecified: Secondary | ICD-10-CM | POA: Insufficient documentation

## 2020-11-10 DIAGNOSIS — Z7984 Long term (current) use of oral hypoglycemic drugs: Secondary | ICD-10-CM | POA: Insufficient documentation

## 2020-11-10 DIAGNOSIS — Z7982 Long term (current) use of aspirin: Secondary | ICD-10-CM | POA: Insufficient documentation

## 2020-11-10 DIAGNOSIS — L03213 Periorbital cellulitis: Secondary | ICD-10-CM | POA: Insufficient documentation

## 2020-11-10 LAB — BASIC METABOLIC PANEL
Anion gap: 10 (ref 5–15)
BUN: 11 mg/dL (ref 6–20)
CO2: 25 mmol/L (ref 22–32)
Calcium: 9.6 mg/dL (ref 8.9–10.3)
Chloride: 102 mmol/L (ref 98–111)
Creatinine, Ser: 0.73 mg/dL (ref 0.44–1.00)
GFR, Estimated: >60 mL/min (ref >60)
Glucose, Bld: 242 mg/dL — ABNORMAL HIGH (ref 70–99)
Potassium: 3.9 mmol/L (ref 3.5–5.1)
Sodium: 137 mmol/L (ref 135–145)

## 2020-11-10 LAB — CBC WITH DIFFERENTIAL/PLATELET
Abs Immature Granulocytes: 0.06 10*3/uL (ref 0.00–0.07)
Basophils Absolute: 0.1 10*3/uL (ref 0.0–0.1)
Basophils Relative: 0 %
Eosinophils Absolute: 0.4 10*3/uL (ref 0.0–0.5)
Eosinophils Relative: 2 %
HCT: 37 % (ref 36.0–46.0)
Hemoglobin: 12.2 g/dL (ref 12.0–15.0)
Immature Granulocytes: 0 %
Lymphocytes Relative: 22 %
Lymphs Abs: 3.3 10*3/uL (ref 0.7–4.0)
MCH: 27.9 pg (ref 26.0–34.0)
MCHC: 33 g/dL (ref 30.0–36.0)
MCV: 84.7 fL (ref 80.0–100.0)
Monocytes Absolute: 0.6 10*3/uL (ref 0.1–1.0)
Monocytes Relative: 4 %
Neutro Abs: 10.5 10*3/uL — ABNORMAL HIGH (ref 1.7–7.7)
Neutrophils Relative %: 72 %
Platelets: 396 10*3/uL (ref 150–400)
RBC: 4.37 MIL/uL (ref 3.87–5.11)
RDW: 13.2 % (ref 11.5–15.5)
WBC: 14.9 10*3/uL — ABNORMAL HIGH (ref 4.0–10.5)
nRBC: 0 % (ref 0.0–0.2)

## 2020-11-10 MED ORDER — IOHEXOL 300 MG/ML  SOLN
75.0000 mL | Freq: Once | INTRAMUSCULAR | Status: AC | PRN
  Administered 2020-11-10: 75 mL via INTRAVENOUS
  Filled 2020-11-10: qty 75

## 2020-11-10 MED ORDER — SULFAMETHOXAZOLE-TRIMETHOPRIM 800-160 MG PO TABS: 1.0000 | ORAL_TABLET | Freq: Two times a day (BID) | ORAL | 0 refills | Status: AC

## 2020-11-10 MED ORDER — AMOXICILLIN-POT CLAVULANATE 875-125 MG PO TABS: 1.0000 | ORAL_TABLET | Freq: Two times a day (BID) | ORAL | 0 refills | Status: AC

## 2020-11-10 MED ORDER — AMOXICILLIN-POT CLAVULANATE 875-125 MG PO TABS
1.0000 | ORAL_TABLET | Freq: Once | ORAL | Status: AC
  Administered 2020-11-10: 1 via ORAL
  Filled 2020-11-10: qty 1

## 2020-11-10 MED ORDER — SULFAMETHOXAZOLE-TRIMETHOPRIM 800-160 MG PO TABS
1.0000 | ORAL_TABLET | Freq: Once | ORAL | Status: AC
  Administered 2020-11-10: 1 via ORAL
  Filled 2020-11-10: qty 1

## 2020-11-10 NOTE — ED Triage Notes (Signed)
Pt arrives to ER c/o R eye pain, discharge, itchy eye x 2 days. Eye swollen. Unsure if pink eye contacts. Thought it was allergies. A&O, ambulatory.

## 2020-11-10 NOTE — ED Notes (Signed)
Pt to ED via POV for allergies.

## 2020-11-10 NOTE — Discharge Instructions (Signed)
Please take both antibiotics as prescribed.  You may also alternate Tylenol and ibuprofen as needed for pain.  Follow-up with ophthalmology or PCP if not improving, or return to ER for any worsening.

## 2020-11-12 NOTE — ED Provider Notes (Signed)
[facility] Emergency Department Provider Note  ____________________________________________   Event Date/Time   First MD Initiated Contact with Patient 11/10/20 1607     (approximate)  I have reviewed the triage vital signs and the nursing notes.   HISTORY  Chief Complaint Eye Problem   HPI Brenda Mccarthy is a 45 y.o. female who presents to the emergency department for evaluation of right eye pain, swelling.  She states that she thought that it was her allergies, however the swelling has worsened and she is having difficulty opening the eye.  She reports that she has open sores in a few places on her body, including the right side of her face and she is unsure if it is related to this.  She reports pain with moving the eye, unsure if blurred vision given that she cannot open the eye well enough to see.       Past Medical History:  Diagnosis Date  . Diabetes mellitus without complication (HCC)   . Diabetic neuropathy (HCC)   . Hydradenitis   . Migraine     Patient Active Problem List   Diagnosis Date Noted  . Sepsis (HCC) 08/20/2016  . Infected sebaceous cyst of skin     Past Surgical History:  Procedure Laterality Date  . ANTERIOR CRUCIATE LIGAMENT REPAIR    . TUBAL LIGATION      Prior to Admission medications   Medication Sig Start Date End Date Taking? Authorizing Provider  amoxicillin-clavulanate (AUGMENTIN) 875-125 MG tablet Take 1 tablet by mouth every 12 (twelve) hours for 10 days. 11/10/20 11/20/20 Yes Dyneisha Murchison, Ruben Gottron, PA  sulfamethoxazole-trimethoprim (BACTRIM DS) 800-160 MG tablet Take 1 tablet by mouth 2 (two) times daily for 10 days. 11/10/20 11/20/20 Yes Lucy Chris, PA  aspirin EC 81 MG tablet Take 81 mg by mouth daily.    [provider]  aspirin-acetaminophen-caffeine (EXCEDRIN MIGRAINE) [phone] MG tablet Take by mouth every 6 (six) hours as needed for headache.    [provider]   cetirizine (ZYRTEC) 10 MG tablet Take 10 mg by mouth daily.    [provider]  DULoxetine (CYMBALTA) 60 MG capsule Take 1 capsule (60 mg total) by mouth daily for 14 days. 09/03/20 09/17/20  Concha Se, MD  fluconazole (DIFLUCAN) 150 MG tablet Take one now and one in a week, repeat weekly as needed for yeast 10/15/17   Fisher, Roselyn Bering, PA-C  gabapentin (NEURONTIN) 300 MG capsule Take 300 mg by mouth daily as needed.    [provider]  glipiZIDE (GLUCOTROL) 5 MG tablet Take 1 tablet (5 mg total) by mouth daily before breakfast for 14 days. 09/03/20 09/17/20  Concha Se, MD  insulin NPH-regular Human (NOVOLIN 70/30) (70-30) 100 UNIT/ML injection Inject 18 Units into the skin 2 (two) times daily with a meal. 08/21/16   Sainani, Rolly Pancake, MD  lisinopril (ZESTRIL) 5 MG tablet Take 1 tablet (5 mg total) by mouth daily for 14 days. 09/03/20 09/17/20  Concha Se, MD  ondansetron (ZOFRAN ODT) 4 MG disintegrating tablet Take 1 tablet (4 mg total) by mouth every 8 (eight) hours as needed for nausea or vomiting. 09/03/20   Concha Se, MD  oxyCODONE-acetaminophen (PERCOCET) 7.5-325 MG tablet Take 1 tablet by mouth every 6 (six) hours as needed. 05/13/19   Joni Reining, PA-C  silver sulfADIAZINE (SILVADENE) 1 % cream Apply 1 application topically daily as needed.    [provider]  sodium chloride (  OCEAN) 0.65 % SOLN nasal spray Place 1 spray into both nostrils as needed for congestion.    [provider]    Allergies Bee venom and Other  Family History  Problem Relation Age of Onset  . Bipolar disorder Mother   . Diabetes Mother   . Heart disease Mother     Social History Social History   Tobacco Use  . Smoking status: Former Games developer  . Smokeless tobacco: Never Used  Substance Use Topics  . Alcohol use: Yes    Comment: occ  . Drug use: Yes    Types: Marijuana    Review of Systems Constitutional: No fever/chills Eyes: + Right eye swelling and  pain ENT: No sore throat. Cardiovascular: Denies chest pain. Respiratory: Denies shortness of breath. Gastrointestinal: No abdominal pain.  No nausea, no vomiting.  No diarrhea.  No constipation. Genitourinary: Negative for dysuria. Musculoskeletal: Negative for back pain. Skin: Negative for rash. Neurological: Negative for headaches, focal weakness or numbness.   ____________________________________________   PHYSICAL EXAM:  VITAL SIGNS: ED Triage Vitals [11/10/20 1515]  Enc Vitals Group     BP 104/72     Pulse Rate (!) 106     Resp 16     Temp 98.5 F (36.9 C)     Temp Source Oral     SpO2 98 %     Weight 193 lb (87.5 kg)     Height 5\' 9"  (1.753 m)     Head Circumference      Peak Flow      Pain Score 6     Pain Loc      Pain Edu?      Excl. in GC?    Constitutional: Alert and oriented. Well appearing and in no acute distress. Eyes: Eyelid and periorbital swelling, erythema.  Eyelid swelling is significant enough that provider has difficulty opening the eye to examine the eye.  Conjunctivae are normal. PERRL.  EOMs intact but with increased pain with medial and lateral gaze. Head: Atraumatic. Nose: No congestion/rhinnorhea. Mouth/Throat: Mucous membranes are moist.  Oropharynx non-erythematous. Neck: No stridor.   Cardiovascular: Normal rate, regular rhythm. Grossly normal heart sounds.  Good peripheral circulation. Respiratory: Normal respiratory effort.  No retractions. Lungs CTAB. Gastrointestinal: Soft and nontender. No distention. No abdominal bruits. No CVA tenderness. Musculoskeletal: No lower extremity tenderness nor edema.  No joint effusions. Neurologic:  Normal speech and language. No gross focal neurologic deficits are appreciated. No gait instability. Skin:  Skin is warm, dry and intact. No rash noted. Psychiatric: Mood and affect are normal. Speech and behavior are normal.  ____________________________________________   LABS (all labs ordered are  listed, but only abnormal results are displayed)  Labs Reviewed  CBC WITH DIFFERENTIAL/PLATELET - Abnormal; Notable for the following components:      Result Value   WBC 14.9 (*)    Neutro Abs 10.5 (*)    All other components within normal limits  BASIC METABOLIC PANEL - Abnormal; Notable for the following components:   Glucose, Bld 242 (*)    All other components within normal limits    ____________________________________________  RADIOLOGY  CT of the orbits with contrast was obtained and demonstrates findings consistent with preseptal cellulitis per radiology report, no evidence of preseptal cellulitis or abscess  ____________________________________________   INITIAL IMPRESSION / ASSESSMENT AND PLAN / ED COURSE  As part of my medical decision making, I reviewed the following data within the electronic MEDICAL RECORD NUMBER Nursing notes reviewed and incorporated,  Labs reviewed and Notes from prior ED visits        Patient is a 45 year old female with past medical history significant for diabetes who reports to the patient of right eye swelling and pain has been worsening over the last few days.  See HPI for further details.  She denies fever, chills or other illness symptoms.  In triage, patient has normal vital signs.  On physical exam there is significant swelling and erythema of the eyelid and surrounding orbit.  Patient has pain with EOMs, though she is able to perform it through full range of motion.  Exam of the eye itself does not reveal any conjunctival erythema, drainage.  Remainder of physical exam grossly unremarkable.  Laboratory evaluation reveals a leukocytosis of 14.9 with left shift, glucose of 242.  CT of the orbits was obtained and is consistent with preseptal cellulitis, no evidence of post septal cellulitis or abscess.  Will initiate treatment with Augmentin and Bactrim.  Encourage close follow-up with PCP and ophthalmology.  She was given contact info for both.   Return precautions were discussed, patient is stable this time for outpatient management.      ____________________________________________   FINAL CLINICAL IMPRESSION(S) / ED DIAGNOSES  Final diagnoses:  Preseptal cellulitis of right eye     ED Discharge Orders         Ordered    sulfamethoxazole-trimethoprim (BACTRIM DS) 800-160 MG tablet  2 times daily        11/10/20 1822    amoxicillin-clavulanate (AUGMENTIN) 875-125 MG tablet  Every 12 hours        11/10/20 1822          *Please note:  Madeline Pho was evaluated in Emergency Department on 11/12/2020 for the symptoms described in the history of present illness. She was evaluated in the context of the global COVID-19 pandemic, which necessitated consideration that the patient might be at risk for infection with the SARS-CoV-2 virus that causes COVID-19. Institutional protocols and algorithms that pertain to the evaluation of patients at risk for COVID-19 are in a state of rapid change based on information released by regulatory bodies including the CDC and federal and state organizations. These policies and algorithms were followed during the patient's care in the ED.  Some ED evaluations and interventions may be delayed as a result of limited staffing during and the pandemic.*   Note:  This document was prepared using Dragon voice recognition software and may include unintentional dictation errors.   Lucy Chris, PA 11/13/20 Marlyne Beards    Delton Prairie, MD 11/13/20 [phone]

## 2020-12-05 ENCOUNTER — Other Ambulatory Visit: Payer: Self-pay

## 2020-12-05 ENCOUNTER — Emergency Department
Admission: EM | Admit: 2020-12-05 | Discharge: 2020-12-05 | Disposition: A | Discharge status: Home / Self Care | Payer: Self-pay | Attending: Emergency Medicine | Admitting: Emergency Medicine

## 2020-12-05 ENCOUNTER — Emergency Department: Payer: Self-pay

## 2020-12-05 ENCOUNTER — Encounter: Payer: Self-pay | Admitting: Emergency Medicine

## 2020-12-05 DIAGNOSIS — Z7984 Long term (current) use of oral hypoglycemic drugs: Secondary | ICD-10-CM | POA: Insufficient documentation

## 2020-12-05 DIAGNOSIS — R300 Dysuria: Secondary | ICD-10-CM | POA: Insufficient documentation

## 2020-12-05 DIAGNOSIS — R112 Nausea with vomiting, unspecified: Secondary | ICD-10-CM

## 2020-12-05 DIAGNOSIS — Z7982 Long term (current) use of aspirin: Secondary | ICD-10-CM | POA: Insufficient documentation

## 2020-12-05 DIAGNOSIS — E1140 Type 2 diabetes mellitus with diabetic neuropathy, unspecified: Secondary | ICD-10-CM | POA: Insufficient documentation

## 2020-12-05 DIAGNOSIS — Z794 Long term (current) use of insulin: Secondary | ICD-10-CM | POA: Insufficient documentation

## 2020-12-05 DIAGNOSIS — K3184 Gastroparesis: Secondary | ICD-10-CM

## 2020-12-05 DIAGNOSIS — Z87891 Personal history of nicotine dependence: Secondary | ICD-10-CM | POA: Insufficient documentation

## 2020-12-05 LAB — PREGNANCY, URINE: Preg Test, Ur: NEGATIVE

## 2020-12-05 LAB — CBC WITH DIFFERENTIAL/PLATELET
Abs Immature Granulocytes: 0.14 10*3/uL — ABNORMAL HIGH (ref 0.00–0.07)
Basophils Absolute: 0 10*3/uL (ref 0.0–0.1)
Basophils Relative: 0 %
Eosinophils Absolute: 0 10*3/uL (ref 0.0–0.5)
Eosinophils Relative: 0 %
HCT: 38.8 % (ref 36.0–46.0)
Hemoglobin: 13.2 g/dL (ref 12.0–15.0)
Immature Granulocytes: 1 %
Lymphocytes Relative: 8 %
Lymphs Abs: 1.4 10*3/uL (ref 0.7–4.0)
MCH: 27.3 pg (ref 26.0–34.0)
MCHC: 34 g/dL (ref 30.0–36.0)
MCV: 80.3 fL (ref 80.0–100.0)
Monocytes Absolute: 0.3 10*3/uL (ref 0.1–1.0)
Monocytes Relative: 2 %
Neutro Abs: 14.9 10*3/uL — ABNORMAL HIGH (ref 1.7–7.7)
Neutrophils Relative %: 89 %
Platelets: 389 10*3/uL (ref 150–400)
RBC: 4.83 MIL/uL (ref 3.87–5.11)
RDW: 13.1 % (ref 11.5–15.5)
WBC: 16.7 10*3/uL — ABNORMAL HIGH (ref 4.0–10.5)
nRBC: 0 % (ref 0.0–0.2)

## 2020-12-05 LAB — URINALYSIS, COMPLETE (UACMP) WITH MICROSCOPIC
Bacteria, UA: NONE SEEN
Bilirubin Urine: NEGATIVE
Glucose, UA: >=500 mg/dL — AB
Ketones, ur: 20 mg/dL — AB
Leukocytes,Ua: NEGATIVE
Nitrite: NEGATIVE
Protein, ur: 100 mg/dL — AB
Specific Gravity, Urine: 1.026 (ref 1.005–1.030)
pH: 6 (ref 5.0–8.0)

## 2020-12-05 LAB — COMPREHENSIVE METABOLIC PANEL
ALT: 19 U/L (ref 0–44)
AST: 28 U/L (ref 15–41)
Albumin: 4.5 g/dL (ref 3.5–5.0)
Alkaline Phosphatase: 177 U/L — ABNORMAL HIGH (ref 38–126)
Anion gap: 13 (ref 5–15)
BUN: 10 mg/dL (ref 6–20)
CO2: 27 mmol/L (ref 22–32)
Calcium: 9.6 mg/dL (ref 8.9–10.3)
Chloride: 93 mmol/L — ABNORMAL LOW (ref 98–111)
Creatinine, Ser: 0.8 mg/dL (ref 0.44–1.00)
GFR, Estimated: >60 mL/min (ref >60)
Glucose, Bld: 378 mg/dL — ABNORMAL HIGH (ref 70–99)
Potassium: 3.6 mmol/L (ref 3.5–5.1)
Sodium: 133 mmol/L — ABNORMAL LOW (ref 135–145)
Total Bilirubin: 0.8 mg/dL (ref 0.3–1.2)
Total Protein: 9.5 g/dL — ABNORMAL HIGH (ref 6.5–8.1)

## 2020-12-05 LAB — TROPONIN I (HIGH SENSITIVITY)
Troponin I (High Sensitivity): 18 ng/L — ABNORMAL HIGH (ref <18)
Troponin I (High Sensitivity): 22 ng/L — ABNORMAL HIGH (ref <18)

## 2020-12-05 LAB — LIPASE, BLOOD: Lipase: 17 U/L (ref 11–51)

## 2020-12-05 MED ORDER — METOCLOPRAMIDE HCL 10 MG PO TABS: 10.0000 mg | ORAL_TABLET | Freq: Four times a day (QID) | ORAL | 0 refills | Status: DC | PRN

## 2020-12-05 MED ORDER — METOCLOPRAMIDE HCL 10 MG PO TABS: 10.0000 mg | ORAL_TABLET | Freq: Four times a day (QID) | ORAL | 0 refills | Status: AC | PRN

## 2020-12-05 MED ORDER — METOCLOPRAMIDE HCL 5 MG/ML IJ SOLN
10.0000 mg | Freq: Once | INTRAMUSCULAR | Status: AC
  Administered 2020-12-05: 10 mg via INTRAVENOUS
  Filled 2020-12-05: qty 2

## 2020-12-05 MED ORDER — MORPHINE SULFATE (PF) 4 MG/ML IV SOLN
4.0000 mg | Freq: Once | INTRAVENOUS | Status: AC
  Administered 2020-12-05: 4 mg via INTRAVENOUS
  Filled 2020-12-05: qty 1

## 2020-12-05 MED ORDER — IOHEXOL 300 MG/ML  SOLN
100.0000 mL | Freq: Once | INTRAMUSCULAR | Status: AC | PRN
  Administered 2020-12-05: 100 mL via INTRAVENOUS

## 2020-12-05 MED ORDER — DROPERIDOL 2.5 MG/ML IJ SOLN
2.5000 mg | Freq: Once | INTRAMUSCULAR | Status: AC
  Administered 2020-12-05: 2.5 mg via INTRAVENOUS
  Filled 2020-12-05: qty 2

## 2020-12-05 MED ORDER — LACTATED RINGERS IV BOLUS
1000.0000 mL | Freq: Once | INTRAVENOUS | Status: AC
  Administered 2020-12-05: 1000 mL via INTRAVENOUS

## 2020-12-05 NOTE — ED Notes (Addendum)
Pt assisted up to toilet around 0915. When placing monitoring devices back on pt, pt reported since receiving medication she has started feeling nauseous again.

## 2020-12-05 NOTE — ED Provider Notes (Signed)
[facility] Emergency Department Provider Note   ____________________________________________   Event Date/Time   First MD Initiated Contact with Patient 12/05/20 [phone]     (approximate)  I have reviewed the triage vital signs and the nursing notes.   HISTORY  Chief Complaint Emesis    HPI Brenda Mccarthy is a 45 y.o. female with past medical history of diabetes, migraines, and hidradenitis who presents to the ED complaining of nausea and vomiting.  Patient reports that she ran out of her "stomach medicine" over 1 week ago and has been dealing with nausea and vomiting since.  She states that vomiting has gotten severe enough over the past 24 hours that she is unable to keep down either liquids or solids.  This is associated with diffuse abdominal pain, which she describes as sharp and severe.  Pain is exacerbated when she goes to vomit.  She reports noticing small streaks of blood in her emesis.  She denies any constipation, diarrhea, or blood in her stool.  She does endorse some dysuria, denies fevers or flank pain.  She describes current symptoms as similar to her prior episodes of diabetic gastroparesis.        Past Medical History:  Diagnosis Date   Diabetes mellitus without complication (HCC)    Diabetic neuropathy (HCC)    Hydradenitis    Migraine     Patient Active Problem List   Diagnosis Date Noted   Sepsis (HCC) 08/20/2016   Infected sebaceous cyst of skin     Past Surgical History:  Procedure Laterality Date   ANTERIOR CRUCIATE LIGAMENT REPAIR     TUBAL LIGATION      Prior to Admission medications   Medication Sig Start Date End Date Taking? Authorizing Provider  metoCLOPramide (REGLAN) 10 MG tablet Take 1 tablet (10 mg total) by mouth every 6 (six) hours as needed for nausea. 12/05/20 01/04/21 Yes Chesley Noon, MD  aspirin EC 81 MG tablet Take 81 mg by mouth daily.    [provider]   aspirin-acetaminophen-caffeine (EXCEDRIN MIGRAINE) [phone] MG tablet Take by mouth every 6 (six) hours as needed for headache.    [provider]  cetirizine (ZYRTEC) 10 MG tablet Take 10 mg by mouth daily.    [provider]  DULoxetine (CYMBALTA) 60 MG capsule Take 1 capsule (60 mg total) by mouth daily for 14 days. 09/03/20 09/17/20  Concha Se, MD  fluconazole (DIFLUCAN) 150 MG tablet Take one now and one in a week, repeat weekly as needed for yeast 10/15/17   Fisher, Roselyn Bering, PA-C  gabapentin (NEURONTIN) 300 MG capsule Take 300 mg by mouth daily as needed.    [provider]  glipiZIDE (GLUCOTROL) 5 MG tablet Take 1 tablet (5 mg total) by mouth daily before breakfast for 14 days. 09/03/20 09/17/20  Concha Se, MD  insulin NPH-regular Human (NOVOLIN 70/30) (70-30) 100 UNIT/ML injection Inject 18 Units into the skin 2 (two) times daily with a meal. 08/21/16   Sainani, Rolly Pancake, MD  lisinopril (ZESTRIL) 5 MG tablet Take 1 tablet (5 mg total) by mouth daily for 14 days. 09/03/20 09/17/20  Concha Se, MD  ondansetron (ZOFRAN ODT) 4 MG disintegrating tablet Take 1 tablet (4 mg total) by mouth every 8 (eight) hours as needed for nausea or vomiting. 09/03/20   Concha Se, MD  oxyCODONE-acetaminophen (PERCOCET) 7.5-325 MG tablet Take 1 tablet by mouth every 6 (six) hours as needed. 05/13/19   Katrinka Blazing,  Arther Abbott, PA-C  silver sulfADIAZINE (SILVADENE) 1 % cream Apply 1 application topically daily as needed.    [provider]  sodium chloride (OCEAN) 0.65 % SOLN nasal spray Place 1 spray into both nostrils as needed for congestion.    [provider]    Allergies Bee venom and Other  Family History  Problem Relation Age of Onset   Bipolar disorder Mother    Diabetes Mother    Heart disease Mother     Social History Social History   Tobacco Use   Smoking status: Former    Pack years: 0.00   Smokeless tobacco: Never  Vaping Use   Vaping Use:  Never used  Substance Use Topics   Alcohol use: Yes    Comment: occ   Drug use: Yes    Types: Marijuana    Review of Systems  Constitutional: No fever/chills Eyes: No visual changes. ENT: No sore throat. Cardiovascular: Denies chest pain. Respiratory: Denies shortness of breath. Gastrointestinal: Positive for abdominal pain, nausea, and vomiting.  No diarrhea.  No constipation. Genitourinary: Negative for dysuria. Musculoskeletal: Negative for back pain. Skin: Negative for rash. Neurological: Negative for headaches, focal weakness or numbness.  ____________________________________________   PHYSICAL EXAM:  VITAL SIGNS: ED Triage Vitals  Enc Vitals Group     BP 12/05/20 0623 (!) 191/90     Pulse Rate 12/05/20 0623 99     Resp 12/05/20 0623 18     Temp 12/05/20 0623 98.2 F (36.8 C)     Temp Source 12/05/20 0623 Oral     SpO2 12/05/20 0623 100 %     Weight 12/05/20 0624 190 lb (86.2 kg)     Height 12/05/20 0624 5\' 9"  (1.753 m)     Head Circumference --      Peak Flow --      Pain Score 12/05/20 0624 10     Pain Loc --      Pain Edu? --      Excl. in GC? --     Constitutional: Alert and oriented. Eyes: Conjunctivae are normal. Head: Atraumatic. Nose: No congestion/rhinnorhea. Mouth/Throat: Mucous membranes are moist. Neck: Normal ROM Cardiovascular: Normal rate, regular rhythm. Grossly normal heart sounds.  2+ radial pulses bilaterally. Respiratory: Normal respiratory effort.  No retractions. Lungs CTAB. Gastrointestinal: Soft and diffusely tender to palpation with voluntary guarding. No distention. Genitourinary: deferred Musculoskeletal: No lower extremity tenderness nor edema. Neurologic:  Normal speech and language. No gross focal neurologic deficits are appreciated. Skin:  Skin is warm, dry and intact. No rash noted. Psychiatric: Mood and affect are normal. Speech and behavior are normal.  ____________________________________________   LABS (all labs  ordered are listed, but only abnormal results are displayed)  Labs Reviewed  CBC WITH DIFFERENTIAL/PLATELET - Abnormal; Notable for the following components:      Result Value   WBC 16.7 (*)    Neutro Abs 14.9 (*)    Abs Immature Granulocytes 0.14 (*)    All other components within normal limits  COMPREHENSIVE METABOLIC PANEL - Abnormal; Notable for the following components:   Sodium 133 (*)    Chloride 93 (*)    Glucose, Bld 378 (*)    Total Protein 9.5 (*)    Alkaline Phosphatase 177 (*)    All other components within normal limits  URINALYSIS, COMPLETE (UACMP) WITH MICROSCOPIC - Abnormal; Notable for the following components:   Color, Urine YELLOW (*)    APPearance CLEAR (*)    Glucose, UA >=  500 (*)    Hgb urine dipstick SMALL (*)    Ketones, ur 20 (*)    Protein, ur 100 (*)    All other components within normal limits  TROPONIN I (HIGH SENSITIVITY) - Abnormal; Notable for the following components:   Troponin I (High Sensitivity) 18 (*)    All other components within normal limits  TROPONIN I (HIGH SENSITIVITY) - Abnormal; Notable for the following components:   Troponin I (High Sensitivity) 22 (*)    All other components within normal limits  LIPASE, BLOOD  PREGNANCY, URINE   ____________________________________________  EKG  ED ECG REPORT I, Chesley Noon, the attending physician, personally viewed and interpreted this ECG.   Date: 12/05/2020  EKG Time: 6:30  Rate: 101  Rhythm: sinus tachycardia  Axis: Normal  Intervals:none  ST&T Change: None   PROCEDURES  Procedure(s) performed (including Critical Care):  Procedures   ____________________________________________   INITIAL IMPRESSION / ASSESSMENT AND PLAN / ED COURSE      45 year old female with past medical history of diabetes, migraines, and hidradenitis who presents to the ED complaining of greater than 1 week of nausea and vomiting, now unable to tolerate liquids or solids for the past 24  hours with diffuse abdominal pain.  Patient does have significant abdominal pain with diffuse tenderness and voluntary guarding on exam.  Labs significant for leukocytosis and hyperglycemia, no electrolyte abnormality or acidosis to suggest DKA.  Given her diffuse pain and leukocytosis, we will further assess with CT scan, treat symptomatically with IV morphine and Reglan.  We will hydrate with IV fluids for hyperglycemia, UA and pregnancy testing are pending.  CT scan is negative for acute process, pregnancy testing is negative and UA shows no signs of infection.  Patient continues to feel nauseous with inability to tolerate liquids following Reglan and morphine.  She was given IV droperidol with improvement in symptoms, is now tolerating water and graham crackers.  She is appropriate for discharge home and we will prescribe Reglan, patient counseled to follow-up with her PCP and return to the ED for new or worsening symptoms.  Patient agrees with plan.      ____________________________________________   FINAL CLINICAL IMPRESSION(S) / ED DIAGNOSES  Final diagnoses:  Non-intractable vomiting with nausea, unspecified vomiting type  Gastroparesis     ED Discharge Orders          Ordered    metoCLOPramide (REGLAN) 10 MG tablet  Every 6 hours PRN        12/05/20 1345             Note:  This document was prepared using Dragon voice recognition software and may include unintentional dictation errors.    Chesley Noon, MD 12/05/20 1348

## 2020-12-05 NOTE — ED Notes (Signed)
Pt able to tolerate PO intake without emesis. MD aware

## 2020-12-05 NOTE — ED Triage Notes (Addendum)
Patient ambulatory to triage with steady gait, without difficulty or distress noted; pt reports N/V since 6/7; st hx of "diabetic gastroparesis" with generalized abd pain

## 2020-12-05 NOTE — ED Notes (Signed)
Patient transported to CT 

## 2020-12-06 ENCOUNTER — Encounter: Payer: Self-pay | Admitting: Emergency Medicine

## 2020-12-06 DIAGNOSIS — Z7982 Long term (current) use of aspirin: Secondary | ICD-10-CM | POA: Insufficient documentation

## 2020-12-06 DIAGNOSIS — Z87891 Personal history of nicotine dependence: Secondary | ICD-10-CM | POA: Insufficient documentation

## 2020-12-06 DIAGNOSIS — E876 Hypokalemia: Secondary | ICD-10-CM | POA: Insufficient documentation

## 2020-12-06 DIAGNOSIS — E1143 Type 2 diabetes mellitus with diabetic autonomic (poly)neuropathy: Secondary | ICD-10-CM | POA: Insufficient documentation

## 2020-12-06 DIAGNOSIS — D72829 Elevated white blood cell count, unspecified: Secondary | ICD-10-CM | POA: Insufficient documentation

## 2020-12-06 DIAGNOSIS — Z794 Long term (current) use of insulin: Secondary | ICD-10-CM | POA: Insufficient documentation

## 2020-12-06 DIAGNOSIS — Z7984 Long term (current) use of oral hypoglycemic drugs: Secondary | ICD-10-CM | POA: Insufficient documentation

## 2020-12-06 DIAGNOSIS — K3184 Gastroparesis: Secondary | ICD-10-CM | POA: Insufficient documentation

## 2020-12-06 LAB — COMPREHENSIVE METABOLIC PANEL
ALT: 16 U/L (ref 0–44)
AST: 18 U/L (ref 15–41)
Albumin: 4.1 g/dL (ref 3.5–5.0)
Alkaline Phosphatase: 151 U/L — ABNORMAL HIGH (ref 38–126)
Anion gap: 11 (ref 5–15)
BUN: 30 mg/dL — ABNORMAL HIGH (ref 6–20)
CO2: 29 mmol/L (ref 22–32)
Calcium: 9.9 mg/dL (ref 8.9–10.3)
Chloride: 94 mmol/L — ABNORMAL LOW (ref 98–111)
Creatinine, Ser: 1.13 mg/dL — ABNORMAL HIGH (ref 0.44–1.00)
GFR, Estimated: >60 mL/min (ref >60)
Glucose, Bld: 207 mg/dL — ABNORMAL HIGH (ref 70–99)
Potassium: 2.8 mmol/L — ABNORMAL LOW (ref 3.5–5.1)
Sodium: 134 mmol/L — ABNORMAL LOW (ref 135–145)
Total Bilirubin: 0.6 mg/dL (ref 0.3–1.2)
Total Protein: 9.7 g/dL — ABNORMAL HIGH (ref 6.5–8.1)

## 2020-12-06 LAB — CBC
HCT: 42 % (ref 36.0–46.0)
Hemoglobin: 14.1 g/dL (ref 12.0–15.0)
MCH: 27.6 pg (ref 26.0–34.0)
MCHC: 33.6 g/dL (ref 30.0–36.0)
MCV: 82.4 fL (ref 80.0–100.0)
Platelets: 440 10*3/uL — ABNORMAL HIGH (ref 150–400)
RBC: 5.1 MIL/uL (ref 3.87–5.11)
RDW: 13.3 % (ref 11.5–15.5)
WBC: 16 10*3/uL — ABNORMAL HIGH (ref 4.0–10.5)
nRBC: 0 % (ref 0.0–0.2)

## 2020-12-06 LAB — POC URINE PREG, ED: Preg Test, Ur: NEGATIVE

## 2020-12-06 LAB — LIPASE, BLOOD: Lipase: 24 U/L (ref 11–51)

## 2020-12-06 MED ORDER — ONDANSETRON HCL 4 MG/2ML IJ SOLN
4.0000 mg | Freq: Once | INTRAMUSCULAR | Status: AC
  Administered 2020-12-06: 4 mg via INTRAVENOUS
  Filled 2020-12-06: qty 2

## 2020-12-06 MED ORDER — DIPHENHYDRAMINE HCL 50 MG/ML IJ SOLN
25.0000 mg | Freq: Once | INTRAMUSCULAR | Status: AC
  Administered 2020-12-06: 25 mg via INTRAVENOUS
  Filled 2020-12-06: qty 1

## 2020-12-06 MED ORDER — SODIUM CHLORIDE 0.9 % IV BOLUS
1000.0000 mL | Freq: Once | INTRAVENOUS | Status: AC
  Administered 2020-12-06: 1000 mL via INTRAVENOUS

## 2020-12-06 MED ORDER — MORPHINE SULFATE (PF) 4 MG/ML IV SOLN
4.0000 mg | Freq: Once | INTRAVENOUS | Status: AC
  Administered 2020-12-06: 4 mg via INTRAVENOUS
  Filled 2020-12-06: qty 1

## 2020-12-06 NOTE — ED Triage Notes (Signed)
Pt c/o N/V since 6/7 and reports she was seen in ED on 6/16, diagnosed with flare-up of "diabetic gastroparesis," that pt was diagnosed with in March. Pt back to ED tonight due to continued N/V, bloody emesis. Per pt, she has not been able to tolerate medications due to symptoms of N/V.

## 2020-12-07 ENCOUNTER — Emergency Department
Admission: EM | Admit: 2020-12-07 | Discharge: 2020-12-07 | Disposition: A | Discharge status: Home / Self Care | Payer: Self-pay | Attending: Emergency Medicine | Admitting: Emergency Medicine

## 2020-12-07 DIAGNOSIS — K3184 Gastroparesis: Secondary | ICD-10-CM

## 2020-12-07 DIAGNOSIS — E876 Hypokalemia: Secondary | ICD-10-CM

## 2020-12-07 LAB — URINALYSIS, COMPLETE (UACMP) WITH MICROSCOPIC
Bilirubin Urine: NEGATIVE
Glucose, UA: NEGATIVE mg/dL
Hgb urine dipstick: NEGATIVE
Ketones, ur: NEGATIVE mg/dL
Nitrite: NEGATIVE
Protein, ur: 100 mg/dL — AB
Specific Gravity, Urine: 1.028 (ref 1.005–1.030)
WBC, UA: NONE SEEN WBC/hpf (ref 0–5)
pH: 5 (ref 5.0–8.0)

## 2020-12-07 LAB — CBG MONITORING, ED: Glucose-Capillary: 173 mg/dL — ABNORMAL HIGH (ref 70–99)

## 2020-12-07 LAB — MAGNESIUM: Magnesium: 1.8 mg/dL (ref 1.7–2.4)

## 2020-12-07 MED ORDER — DICYCLOMINE HCL 20 MG PO TABS: 20.0000 mg | ORAL_TABLET | Freq: Three times a day (TID) | ORAL | 0 refills | Status: DC | PRN

## 2020-12-07 MED ORDER — POTASSIUM CHLORIDE 20 MEQ PO PACK: 40.0000 meq | PACK | Freq: Every day | ORAL | 0 refills | Status: DC

## 2020-12-07 MED ORDER — LACTATED RINGERS IV BOLUS
1000.0000 mL | Freq: Once | INTRAVENOUS | Status: AC
  Administered 2020-12-07: 1000 mL via INTRAVENOUS

## 2020-12-07 MED ORDER — POTASSIUM CHLORIDE 10 MEQ/100ML IV SOLN
10.0000 meq | INTRAVENOUS | Status: AC
  Administered 2020-12-07 (×2): 10 meq via INTRAVENOUS
  Filled 2020-12-07 (×2): qty 100

## 2020-12-07 MED ORDER — POTASSIUM CHLORIDE 20 MEQ PO PACK
40.0000 meq | PACK | Freq: Once | ORAL | Status: AC
  Administered 2020-12-07: 40 meq via ORAL
  Filled 2020-12-07: qty 2

## 2020-12-07 MED ORDER — POTASSIUM CHLORIDE CRYS ER 20 MEQ PO TBCR
40.0000 meq | EXTENDED_RELEASE_TABLET | Freq: Once | ORAL | Status: DC
  Filled 2020-12-07: qty 2

## 2020-12-07 MED ORDER — DROPERIDOL 2.5 MG/ML IJ SOLN
2.5000 mg | Freq: Once | INTRAMUSCULAR | Status: AC
  Administered 2020-12-07: 2.5 mg via INTRAVENOUS
  Filled 2020-12-07: qty 2

## 2020-12-07 MED ORDER — DICYCLOMINE HCL 10 MG/ML IM SOLN
20.0000 mg | Freq: Once | INTRAMUSCULAR | Status: AC
  Administered 2020-12-07: 20 mg via INTRAMUSCULAR
  Filled 2020-12-07 (×2): qty 2

## 2020-12-07 NOTE — Discharge Instructions (Addendum)
Please continue your Reglan as prescribed for nausea, vomiting, pain.

## 2020-12-07 NOTE — ED Provider Notes (Signed)
[facility] Emergency Department Provider Note  ____________________________________________   Event Date/Time   First MD Initiated Contact with Patient 12/07/20 0109     (approximate)  I have reviewed the triage vital signs and the nursing notes.   HISTORY  Chief Complaint Abdominal Pain    HPI Brenda Mccarthy is a 45 y.o. female with history of diabetes, gastroparesis who presents to the emergency department with nausea, vomiting for the past 10 days.  Patient was out of her Reglan and "stomach medication".  States she just picked this up yesterday.  Was seen in the emergency department on the 16th for the same.  Work-up at that time was unremarkable including a normal CT of the abdomen pelvis.  Symptoms improved with droperidol and she was tolerating p.o. and discharged home.  States she began vomiting again tonight and starting to have diarrhea as well.  Complains of diffuse sharp abdominal pain.  No fevers, dysuria, vaginal bleeding or discharge.  Denies chest pain or shortness of breath.        Past Medical History:  Diagnosis Date   Diabetes mellitus without complication (HCC)    Diabetic neuropathy (HCC)    Hydradenitis    Migraine     Patient Active Problem List   Diagnosis Date Noted   Sepsis (HCC) 08/20/2016   Infected sebaceous cyst of skin     Past Surgical History:  Procedure Laterality Date   ANTERIOR CRUCIATE LIGAMENT REPAIR     TUBAL LIGATION      Prior to Admission medications   Medication Sig Start Date End Date Taking? Authorizing Provider  dicyclomine (BENTYL) 20 MG tablet Take 1 tablet (20 mg total) by mouth every 8 (eight) hours as needed for spasms (Abdominal cramping). 12/07/20  Yes Brandalynn Ofallon, Layla Maw, DO  potassium chloride (KLOR-CON) 20 MEQ packet Take 40 mEq by mouth daily. 12/07/20  Yes Mitchell Epling, Layla Maw, DO  aspirin EC 81 MG tablet Take 81 mg by mouth daily.    [provider]   aspirin-acetaminophen-caffeine (EXCEDRIN MIGRAINE) [phone] MG tablet Take by mouth every 6 (six) hours as needed for headache.    [provider]  cetirizine (ZYRTEC) 10 MG tablet Take 10 mg by mouth daily.    [provider]  DULoxetine (CYMBALTA) 60 MG capsule Take 1 capsule (60 mg total) by mouth daily for 14 days. 09/03/20 09/17/20  Concha Se, MD  fluconazole (DIFLUCAN) 150 MG tablet Take one now and one in a week, repeat weekly as needed for yeast 10/15/17   Fisher, Roselyn Bering, PA-C  gabapentin (NEURONTIN) 300 MG capsule Take 300 mg by mouth daily as needed.    [provider]  glipiZIDE (GLUCOTROL) 5 MG tablet Take 1 tablet (5 mg total) by mouth daily before breakfast for 14 days. 09/03/20 09/17/20  Concha Se, MD  insulin NPH-regular Human (NOVOLIN 70/30) (70-30) 100 UNIT/ML injection Inject 18 Units into the skin 2 (two) times daily with a meal. 08/21/16   Sainani, Rolly Pancake, MD  lisinopril (ZESTRIL) 5 MG tablet Take 1 tablet (5 mg total) by mouth daily for 14 days. 09/03/20 09/17/20  Concha Se, MD  metoCLOPramide (REGLAN) 10 MG tablet Take 1 tablet (10 mg total) by mouth every 6 (six) hours as needed for nausea. 12/05/20 01/04/21  Chesley Noon, MD  ondansetron (ZOFRAN ODT) 4 MG disintegrating tablet Take 1 tablet (4 mg total) by mouth every 8 (eight) hours as needed for nausea or vomiting. 09/03/20  Concha SeFunke, Mary E, MD  oxyCODONE-acetaminophen (PERCOCET) 7.5-325 MG tablet Take 1 tablet by mouth every 6 (six) hours as needed. 05/13/19   Joni ReiningSmith, Ronald K, PA-C  silver sulfADIAZINE (SILVADENE) 1 % cream Apply 1 application topically daily as needed.    [provider]  sodium chloride (OCEAN) 0.65 % SOLN nasal spray Place 1 spray into both nostrils as needed for congestion.    [provider]    Allergies Bee venom and Other  Family History  Problem Relation Age of Onset   Bipolar disorder Mother    Diabetes Mother    Heart disease Mother      Social History Social History   Tobacco Use   Smoking status: Former    Pack years: 0.00   Smokeless tobacco: Never  Vaping Use   Vaping Use: Never used  Substance Use Topics   Alcohol use: Yes    Comment: occ   Drug use: Yes    Types: Marijuana    Review of Systems Constitutional: No fever. Eyes: No visual changes. ENT: No sore throat. Cardiovascular: Denies chest pain. Respiratory: Denies shortness of breath. Gastrointestinal: + nausea, vomiting, diarrhea. Genitourinary: Negative for dysuria. Musculoskeletal: Negative for back pain. Skin: Negative for rash. Neurological: Negative for focal weakness or numbness.  ____________________________________________   PHYSICAL EXAM:  VITAL SIGNS: ED Triage Vitals  Enc Vitals Group     BP 12/06/20 2159 123/81     Pulse Rate 12/06/20 2159 (!) 104     Resp 12/06/20 2159 18     Temp 12/06/20 2351 98.5 F (36.9 C)     Temp Source 12/06/20 2351 Oral     SpO2 12/06/20 2159 100 %     Weight --      Height --      Head Circumference --      Peak Flow --      Pain Score 12/07/20 0040 10     Pain Loc --      Pain Edu? --      Excl. in GC? --    CONSTITUTIONAL: Alert and oriented and responds appropriately to questions.  Chronically ill-appearing, appears older than stated age HEAD: Normocephalic EYES: Conjunctivae clear, pupils appear equal, EOM appear intact ENT: normal nose; moist mucous membranes NECK: Supple, normal ROM CARD: RRR; S1 and S2 appreciated; no murmurs, no clicks, no rubs, no gallops RESP: Normal chest excursion without splinting or tachypnea; breath sounds clear and equal bilaterally; no wheezes, no rhonchi, no rales, no hypoxia or respiratory distress, speaking full sentences ABD/GI: Normal bowel sounds; non-distended; soft, complains of diffuse tenderness to palpation but no rebound, no guarding, no peritoneal signs, no hepatosplenomegaly BACK: The back appears normal EXT: Normal ROM in all joints;  no deformity noted, no edema; no cyanosis SKIN: Normal color for age and race; warm; no rash on exposed skin NEURO: Moves all extremities equally PSYCH: The patient's mood and manner are appropriate.  ____________________________________________   LABS (all labs ordered are listed, but only abnormal results are displayed)  Labs Reviewed  COMPREHENSIVE METABOLIC PANEL - Abnormal; Notable for the following components:      Result Value   Sodium 134 (*)    Potassium 2.8 (*)    Chloride 94 (*)    Glucose, Bld 207 (*)    BUN 30 (*)    Creatinine, Ser 1.13 (*)    Total Protein 9.7 (*)    Alkaline Phosphatase 151 (*)    All other components within normal limits  CBC - Abnormal; Notable for the following components:   WBC 16.0 (*)    Platelets 440 (*)    All other components within normal limits  URINALYSIS, COMPLETE (UACMP) WITH MICROSCOPIC - Abnormal; Notable for the following components:   Color, Urine YELLOW (*)    APPearance TURBID (*)    Protein, ur 100 (*)    Leukocytes,Ua MODERATE (*)    Bacteria, UA RARE (*)    All other components within normal limits  CBG MONITORING, ED - Abnormal; Notable for the following components:   Glucose-Capillary 173 (*)    All other components within normal limits  LIPASE, BLOOD  MAGNESIUM  POC URINE PREG, ED   ____________________________________________  EKG   EKG Interpretation  Date/Time:  Friday December 06 2020 21:55:47 EDT Ventricular Rate:  101 PR Interval:  116 QRS Duration: 82 QT Interval:  350 QTC Calculation: 453 R Axis:   -51 Text Interpretation: Sinus tachycardia Left anterior fascicular block Cannot rule out Anterior infarct , age undetermined Abnormal ECG Confirmed by Rochele Raring [phone]) on 12/07/2020 1:08:19 AM         ____________________________________________  RADIOLOGY Normajean Baxter Lakeem Rozo, personally viewed and evaluated these images (plain radiographs) as part of my medical decision making, as well as  reviewing the written report by the radiologist.  ED MD interpretation:    Official radiology report(s): No results found.  ____________________________________________   PROCEDURES  Procedure(s) performed (including Critical Care):  Procedures   ____________________________________________   INITIAL IMPRESSION / ASSESSMENT AND PLAN / ED COURSE  As part of my medical decision making, I reviewed the following data within the electronic MEDICAL RECORD NUMBER Nursing notes reviewed and incorporated, Labs reviewed , Old chart reviewed, Notes from prior ED visits, and [place] Controlled Substance Database         Patient here with complaints of gastroparesis, vomiting and diarrhea.  Seen here on the 16th and had lab work that showed leukocytosis which appears chronic.  CT scan of her abdomen pelvis was unremarkable for acute pathology.  Labs today here show stable leukocytosis.  She now has hypokalemia with potassium of 2.8 without EKG changes.  Will check magnesium level.  Will give IV and oral potassium replacement.  Will hydrate patient, give droperidol for symptomatic relief and p.o. challenge.  Blood glucose currently 173.  No ketones in her urine or hypotension to suggest severe dehydration.  Pregnancy test is negative.  Abdominal exam benign.  ED PROGRESS  Patient tolerating p.o. here with no further vomiting.  Will discharge home after potassium replacement.  Will discharge with Bentyl, potassium.  She states she has picked up her Reglan from the pharmacy.  At this time, I do not feel there is any life-threatening condition present. I have reviewed, interpreted and discussed all results (EKG, imaging, lab, urine as appropriate) and exam findings with patient/family. I have reviewed nursing notes and appropriate previous records.  I feel the patient is safe to be discharged home without further emergent workup and can continue workup as an outpatient as needed. Discussed usual and customary  return precautions. Patient/family verbalize understanding and are comfortable with this plan.  Outpatient follow-up has been provided as needed. All questions have been answered.  ____________________________________________   FINAL CLINICAL IMPRESSION(S) / ED DIAGNOSES  Final diagnoses:  Gastroparesis  Hypokalemia     ED Discharge Orders          Ordered    dicyclomine (BENTYL) 20 MG tablet  Every 8 hours PRN  12/07/20 0402    potassium chloride (KLOR-CON) 20 MEQ packet  Daily        12/07/20 0402            *Please note:  Morghan Kester was evaluated in Emergency Department on 12/07/2020 for the symptoms described in the history of present illness. She was evaluated in the context of the global COVID-19 pandemic, which necessitated consideration that the patient might be at risk for infection with the SARS-CoV-2 virus that causes COVID-19. Institutional protocols and algorithms that pertain to the evaluation of patients at risk for COVID-19 are in a state of rapid change based on information released by regulatory bodies including the CDC and federal and state organizations. These policies and algorithms were followed during the patient's care in the ED.  Some ED evaluations and interventions may be delayed as a result of limited staffing during and the pandemic.*   Note:  This document was prepared using Dragon voice recognition software and may include unintentional dictation errors.    Mehdi Gironda, Layla Maw, DO 12/07/20 [phone]

## 2022-05-25 DIAGNOSIS — F4310 Post-traumatic stress disorder, unspecified: Secondary | ICD-10-CM | POA: Diagnosis not present

## 2022-05-25 DIAGNOSIS — F329 Major depressive disorder, single episode, unspecified: Secondary | ICD-10-CM | POA: Diagnosis not present

## 2022-05-27 DIAGNOSIS — L97509 Non-pressure chronic ulcer of other part of unspecified foot with unspecified severity: Secondary | ICD-10-CM | POA: Diagnosis not present

## 2022-05-27 DIAGNOSIS — E785 Hyperlipidemia, unspecified: Secondary | ICD-10-CM | POA: Diagnosis not present

## 2022-05-27 DIAGNOSIS — E11621 Type 2 diabetes mellitus with foot ulcer: Secondary | ICD-10-CM | POA: Diagnosis not present

## 2022-05-27 DIAGNOSIS — I10 Essential (primary) hypertension: Secondary | ICD-10-CM | POA: Diagnosis not present

## 2022-06-01 DIAGNOSIS — E785 Hyperlipidemia, unspecified: Secondary | ICD-10-CM | POA: Diagnosis not present

## 2022-06-01 DIAGNOSIS — E11621 Type 2 diabetes mellitus with foot ulcer: Secondary | ICD-10-CM | POA: Diagnosis not present

## 2022-06-01 DIAGNOSIS — K3184 Gastroparesis: Secondary | ICD-10-CM | POA: Diagnosis not present

## 2022-06-01 DIAGNOSIS — F321 Major depressive disorder, single episode, moderate: Secondary | ICD-10-CM | POA: Diagnosis not present

## 2022-06-01 DIAGNOSIS — L97421 Non-pressure chronic ulcer of left heel and midfoot limited to breakdown of skin: Secondary | ICD-10-CM | POA: Diagnosis not present

## 2022-06-01 DIAGNOSIS — M79672 Pain in left foot: Secondary | ICD-10-CM | POA: Diagnosis not present

## 2022-06-08 DIAGNOSIS — F321 Major depressive disorder, single episode, moderate: Secondary | ICD-10-CM | POA: Diagnosis not present

## 2022-06-08 DIAGNOSIS — F4310 Post-traumatic stress disorder, unspecified: Secondary | ICD-10-CM | POA: Diagnosis not present

## 2022-06-24 DIAGNOSIS — F329 Major depressive disorder, single episode, unspecified: Secondary | ICD-10-CM | POA: Diagnosis not present

## 2022-07-01 DIAGNOSIS — E785 Hyperlipidemia, unspecified: Secondary | ICD-10-CM | POA: Diagnosis not present

## 2022-07-01 DIAGNOSIS — L97509 Non-pressure chronic ulcer of other part of unspecified foot with unspecified severity: Secondary | ICD-10-CM | POA: Diagnosis not present

## 2022-07-01 DIAGNOSIS — I10 Essential (primary) hypertension: Secondary | ICD-10-CM | POA: Diagnosis not present

## 2022-07-01 DIAGNOSIS — E11621 Type 2 diabetes mellitus with foot ulcer: Secondary | ICD-10-CM | POA: Diagnosis not present

## 2022-07-07 DIAGNOSIS — G63 Polyneuropathy in diseases classified elsewhere: Secondary | ICD-10-CM | POA: Diagnosis not present

## 2022-07-07 DIAGNOSIS — L97509 Non-pressure chronic ulcer of other part of unspecified foot with unspecified severity: Secondary | ICD-10-CM | POA: Diagnosis not present

## 2022-07-07 DIAGNOSIS — Z23 Encounter for immunization: Secondary | ICD-10-CM | POA: Diagnosis not present

## 2022-07-07 DIAGNOSIS — L97421 Non-pressure chronic ulcer of left heel and midfoot limited to breakdown of skin: Secondary | ICD-10-CM | POA: Diagnosis not present

## 2022-07-07 DIAGNOSIS — E11621 Type 2 diabetes mellitus with foot ulcer: Secondary | ICD-10-CM | POA: Diagnosis not present

## 2022-07-07 DIAGNOSIS — F321 Major depressive disorder, single episode, moderate: Secondary | ICD-10-CM | POA: Diagnosis not present

## 2022-07-07 DIAGNOSIS — E785 Hyperlipidemia, unspecified: Secondary | ICD-10-CM | POA: Diagnosis not present

## 2022-07-07 DIAGNOSIS — Z124 Encounter for screening for malignant neoplasm of cervix: Secondary | ICD-10-CM | POA: Diagnosis not present

## 2022-07-08 DIAGNOSIS — F329 Major depressive disorder, single episode, unspecified: Secondary | ICD-10-CM | POA: Diagnosis not present

## 2022-07-08 DIAGNOSIS — F4310 Post-traumatic stress disorder, unspecified: Secondary | ICD-10-CM | POA: Diagnosis not present

## 2022-07-15 DIAGNOSIS — L97529 Non-pressure chronic ulcer of other part of left foot with unspecified severity: Secondary | ICD-10-CM | POA: Diagnosis not present

## 2022-07-15 DIAGNOSIS — Z794 Long term (current) use of insulin: Secondary | ICD-10-CM | POA: Diagnosis not present

## 2022-07-15 DIAGNOSIS — E1165 Type 2 diabetes mellitus with hyperglycemia: Secondary | ICD-10-CM | POA: Diagnosis not present

## 2022-07-15 DIAGNOSIS — E11621 Type 2 diabetes mellitus with foot ulcer: Secondary | ICD-10-CM | POA: Diagnosis not present

## 2022-07-20 DIAGNOSIS — E1143 Type 2 diabetes mellitus with diabetic autonomic (poly)neuropathy: Secondary | ICD-10-CM | POA: Diagnosis not present

## 2022-07-20 DIAGNOSIS — M1990 Unspecified osteoarthritis, unspecified site: Secondary | ICD-10-CM | POA: Diagnosis not present

## 2022-07-20 DIAGNOSIS — Z87891 Personal history of nicotine dependence: Secondary | ICD-10-CM | POA: Diagnosis not present

## 2022-07-20 DIAGNOSIS — Z794 Long term (current) use of insulin: Secondary | ICD-10-CM | POA: Diagnosis not present

## 2022-07-20 DIAGNOSIS — B9562 Methicillin resistant Staphylococcus aureus infection as the cause of diseases classified elsewhere: Secondary | ICD-10-CM | POA: Diagnosis not present

## 2022-07-20 DIAGNOSIS — D649 Anemia, unspecified: Secondary | ICD-10-CM | POA: Diagnosis not present

## 2022-07-20 DIAGNOSIS — K3184 Gastroparesis: Secondary | ICD-10-CM | POA: Diagnosis not present

## 2022-07-20 DIAGNOSIS — R9431 Abnormal electrocardiogram [ECG] [EKG]: Secondary | ICD-10-CM | POA: Diagnosis not present

## 2022-07-20 DIAGNOSIS — B951 Streptococcus, group B, as the cause of diseases classified elsewhere: Secondary | ICD-10-CM | POA: Diagnosis not present

## 2022-07-20 DIAGNOSIS — E1142 Type 2 diabetes mellitus with diabetic polyneuropathy: Secondary | ICD-10-CM | POA: Diagnosis not present

## 2022-07-20 DIAGNOSIS — M797 Fibromyalgia: Secondary | ICD-10-CM | POA: Diagnosis not present

## 2022-07-20 DIAGNOSIS — M86172 Other acute osteomyelitis, left ankle and foot: Secondary | ICD-10-CM | POA: Diagnosis not present

## 2022-07-20 DIAGNOSIS — R197 Diarrhea, unspecified: Secondary | ICD-10-CM | POA: Diagnosis not present

## 2022-07-20 DIAGNOSIS — E785 Hyperlipidemia, unspecified: Secondary | ICD-10-CM | POA: Diagnosis not present

## 2022-07-20 DIAGNOSIS — S93125A Dislocation of metatarsophalangeal joint of left lesser toe(s), initial encounter: Secondary | ICD-10-CM | POA: Diagnosis not present

## 2022-07-20 DIAGNOSIS — I10 Essential (primary) hypertension: Secondary | ICD-10-CM | POA: Diagnosis not present

## 2022-07-20 DIAGNOSIS — M869 Osteomyelitis, unspecified: Secondary | ICD-10-CM | POA: Diagnosis not present

## 2022-07-20 DIAGNOSIS — E1121 Type 2 diabetes mellitus with diabetic nephropathy: Secondary | ICD-10-CM | POA: Diagnosis not present

## 2022-07-20 DIAGNOSIS — L97529 Non-pressure chronic ulcer of other part of left foot with unspecified severity: Secondary | ICD-10-CM | POA: Diagnosis not present

## 2022-07-20 DIAGNOSIS — L03116 Cellulitis of left lower limb: Secondary | ICD-10-CM | POA: Diagnosis not present

## 2022-07-20 DIAGNOSIS — E1169 Type 2 diabetes mellitus with other specified complication: Secondary | ICD-10-CM | POA: Diagnosis not present

## 2022-07-20 DIAGNOSIS — E11621 Type 2 diabetes mellitus with foot ulcer: Secondary | ICD-10-CM | POA: Diagnosis not present

## 2022-07-21 DIAGNOSIS — E11621 Type 2 diabetes mellitus with foot ulcer: Secondary | ICD-10-CM | POA: Diagnosis not present

## 2022-07-21 DIAGNOSIS — M86172 Other acute osteomyelitis, left ankle and foot: Secondary | ICD-10-CM | POA: Diagnosis not present

## 2022-07-21 DIAGNOSIS — L03116 Cellulitis of left lower limb: Secondary | ICD-10-CM | POA: Diagnosis not present

## 2022-07-21 DIAGNOSIS — L97529 Non-pressure chronic ulcer of other part of left foot with unspecified severity: Secondary | ICD-10-CM | POA: Diagnosis not present

## 2022-07-21 DIAGNOSIS — M609 Myositis, unspecified: Secondary | ICD-10-CM | POA: Diagnosis not present

## 2022-07-22 DIAGNOSIS — L03116 Cellulitis of left lower limb: Secondary | ICD-10-CM | POA: Diagnosis not present

## 2022-07-22 DIAGNOSIS — E11621 Type 2 diabetes mellitus with foot ulcer: Secondary | ICD-10-CM | POA: Diagnosis not present

## 2022-07-22 DIAGNOSIS — E1152 Type 2 diabetes mellitus with diabetic peripheral angiopathy with gangrene: Secondary | ICD-10-CM | POA: Diagnosis not present

## 2022-07-22 DIAGNOSIS — M86172 Other acute osteomyelitis, left ankle and foot: Secondary | ICD-10-CM | POA: Diagnosis not present

## 2022-07-22 DIAGNOSIS — L97524 Non-pressure chronic ulcer of other part of left foot with necrosis of bone: Secondary | ICD-10-CM | POA: Diagnosis not present

## 2022-07-23 DIAGNOSIS — M86172 Other acute osteomyelitis, left ankle and foot: Secondary | ICD-10-CM | POA: Diagnosis not present

## 2022-07-24 DIAGNOSIS — M869 Osteomyelitis, unspecified: Secondary | ICD-10-CM | POA: Diagnosis not present

## 2022-07-24 DIAGNOSIS — M86132 Other acute osteomyelitis, left radius and ulna: Secondary | ICD-10-CM | POA: Diagnosis not present

## 2022-07-24 DIAGNOSIS — I96 Gangrene, not elsewhere classified: Secondary | ICD-10-CM | POA: Diagnosis not present

## 2022-07-24 DIAGNOSIS — Z89422 Acquired absence of other left toe(s): Secondary | ICD-10-CM | POA: Diagnosis not present

## 2022-07-24 DIAGNOSIS — M86172 Other acute osteomyelitis, left ankle and foot: Secondary | ICD-10-CM | POA: Diagnosis not present

## 2022-07-24 DIAGNOSIS — K219 Gastro-esophageal reflux disease without esophagitis: Secondary | ICD-10-CM | POA: Diagnosis not present

## 2022-07-24 DIAGNOSIS — Z9889 Other specified postprocedural states: Secondary | ICD-10-CM | POA: Diagnosis not present

## 2022-07-24 DIAGNOSIS — E1169 Type 2 diabetes mellitus with other specified complication: Secondary | ICD-10-CM | POA: Diagnosis not present

## 2022-07-24 DIAGNOSIS — I10 Essential (primary) hypertension: Secondary | ICD-10-CM | POA: Diagnosis not present

## 2022-07-25 DIAGNOSIS — M86172 Other acute osteomyelitis, left ankle and foot: Secondary | ICD-10-CM | POA: Diagnosis not present

## 2022-07-26 DIAGNOSIS — M86172 Other acute osteomyelitis, left ankle and foot: Secondary | ICD-10-CM | POA: Diagnosis not present

## 2022-07-27 DIAGNOSIS — M86172 Other acute osteomyelitis, left ankle and foot: Secondary | ICD-10-CM | POA: Diagnosis not present

## 2022-07-28 DIAGNOSIS — E1169 Type 2 diabetes mellitus with other specified complication: Secondary | ICD-10-CM | POA: Diagnosis not present

## 2022-07-28 DIAGNOSIS — M86172 Other acute osteomyelitis, left ankle and foot: Secondary | ICD-10-CM | POA: Diagnosis not present

## 2022-07-29 DIAGNOSIS — M86172 Other acute osteomyelitis, left ankle and foot: Secondary | ICD-10-CM | POA: Diagnosis not present

## 2022-07-29 DIAGNOSIS — E1169 Type 2 diabetes mellitus with other specified complication: Secondary | ICD-10-CM | POA: Diagnosis not present

## 2022-07-31 DIAGNOSIS — E1169 Type 2 diabetes mellitus with other specified complication: Secondary | ICD-10-CM | POA: Diagnosis not present

## 2022-07-31 DIAGNOSIS — M86172 Other acute osteomyelitis, left ankle and foot: Secondary | ICD-10-CM | POA: Diagnosis not present

## 2022-07-31 DIAGNOSIS — M869 Osteomyelitis, unspecified: Secondary | ICD-10-CM | POA: Diagnosis not present

## 2022-07-31 DIAGNOSIS — Z9889 Other specified postprocedural states: Secondary | ICD-10-CM | POA: Diagnosis not present

## 2022-08-01 DIAGNOSIS — E1169 Type 2 diabetes mellitus with other specified complication: Secondary | ICD-10-CM | POA: Diagnosis not present

## 2022-08-01 DIAGNOSIS — M86172 Other acute osteomyelitis, left ankle and foot: Secondary | ICD-10-CM | POA: Diagnosis not present

## 2022-08-02 DIAGNOSIS — M86172 Other acute osteomyelitis, left ankle and foot: Secondary | ICD-10-CM | POA: Diagnosis not present

## 2022-08-02 DIAGNOSIS — E1169 Type 2 diabetes mellitus with other specified complication: Secondary | ICD-10-CM | POA: Diagnosis not present

## 2022-08-03 DIAGNOSIS — M86172 Other acute osteomyelitis, left ankle and foot: Secondary | ICD-10-CM | POA: Diagnosis not present

## 2022-08-03 DIAGNOSIS — E1169 Type 2 diabetes mellitus with other specified complication: Secondary | ICD-10-CM | POA: Diagnosis not present

## 2022-08-04 DIAGNOSIS — E1169 Type 2 diabetes mellitus with other specified complication: Secondary | ICD-10-CM | POA: Diagnosis not present

## 2022-08-04 DIAGNOSIS — M86172 Other acute osteomyelitis, left ankle and foot: Secondary | ICD-10-CM | POA: Diagnosis not present

## 2022-08-08 DIAGNOSIS — M86172 Other acute osteomyelitis, left ankle and foot: Secondary | ICD-10-CM | POA: Diagnosis not present

## 2022-08-15 DIAGNOSIS — M86172 Other acute osteomyelitis, left ankle and foot: Secondary | ICD-10-CM | POA: Diagnosis not present

## 2022-08-17 DIAGNOSIS — Z09 Encounter for follow-up examination after completed treatment for conditions other than malignant neoplasm: Secondary | ICD-10-CM | POA: Diagnosis not present

## 2022-08-17 DIAGNOSIS — K3184 Gastroparesis: Secondary | ICD-10-CM | POA: Diagnosis not present

## 2022-08-17 DIAGNOSIS — E1143 Type 2 diabetes mellitus with diabetic autonomic (poly)neuropathy: Secondary | ICD-10-CM | POA: Diagnosis not present

## 2022-08-17 DIAGNOSIS — L97421 Non-pressure chronic ulcer of left heel and midfoot limited to breakdown of skin: Secondary | ICD-10-CM | POA: Diagnosis not present

## 2022-08-17 DIAGNOSIS — E1165 Type 2 diabetes mellitus with hyperglycemia: Secondary | ICD-10-CM | POA: Diagnosis not present

## 2022-08-17 DIAGNOSIS — E11621 Type 2 diabetes mellitus with foot ulcer: Secondary | ICD-10-CM | POA: Diagnosis not present

## 2022-08-17 DIAGNOSIS — Z794 Long term (current) use of insulin: Secondary | ICD-10-CM | POA: Diagnosis not present

## 2022-08-17 DIAGNOSIS — F321 Major depressive disorder, single episode, moderate: Secondary | ICD-10-CM | POA: Diagnosis not present

## 2022-08-17 DIAGNOSIS — E785 Hyperlipidemia, unspecified: Secondary | ICD-10-CM | POA: Diagnosis not present

## 2022-08-19 DIAGNOSIS — A4902 Methicillin resistant Staphylococcus aureus infection, unspecified site: Secondary | ICD-10-CM | POA: Diagnosis not present

## 2022-08-19 DIAGNOSIS — Z792 Long term (current) use of antibiotics: Secondary | ICD-10-CM | POA: Diagnosis not present

## 2022-08-19 DIAGNOSIS — M86171 Other acute osteomyelitis, right ankle and foot: Secondary | ICD-10-CM | POA: Diagnosis not present

## 2022-08-21 DIAGNOSIS — Z419 Encounter for procedure for purposes other than remedying health state, unspecified: Secondary | ICD-10-CM | POA: Diagnosis not present

## 2022-08-22 DIAGNOSIS — M86172 Other acute osteomyelitis, left ankle and foot: Secondary | ICD-10-CM | POA: Diagnosis not present

## 2022-08-25 DIAGNOSIS — F4310 Post-traumatic stress disorder, unspecified: Secondary | ICD-10-CM | POA: Diagnosis not present

## 2022-08-25 DIAGNOSIS — F329 Major depressive disorder, single episode, unspecified: Secondary | ICD-10-CM | POA: Diagnosis not present

## 2022-08-31 DIAGNOSIS — M869 Osteomyelitis, unspecified: Secondary | ICD-10-CM | POA: Diagnosis not present

## 2022-09-02 DIAGNOSIS — Z792 Long term (current) use of antibiotics: Secondary | ICD-10-CM | POA: Diagnosis not present

## 2022-09-02 DIAGNOSIS — M86172 Other acute osteomyelitis, left ankle and foot: Secondary | ICD-10-CM | POA: Diagnosis not present

## 2022-09-16 DIAGNOSIS — F321 Major depressive disorder, single episode, moderate: Secondary | ICD-10-CM | POA: Diagnosis not present

## 2022-09-17 DIAGNOSIS — E11621 Type 2 diabetes mellitus with foot ulcer: Secondary | ICD-10-CM | POA: Diagnosis not present

## 2022-09-17 DIAGNOSIS — L97509 Non-pressure chronic ulcer of other part of unspecified foot with unspecified severity: Secondary | ICD-10-CM | POA: Diagnosis not present

## 2022-09-17 DIAGNOSIS — F321 Major depressive disorder, single episode, moderate: Secondary | ICD-10-CM | POA: Diagnosis not present

## 2022-09-17 DIAGNOSIS — E785 Hyperlipidemia, unspecified: Secondary | ICD-10-CM | POA: Diagnosis not present

## 2022-09-17 DIAGNOSIS — L97421 Non-pressure chronic ulcer of left heel and midfoot limited to breakdown of skin: Secondary | ICD-10-CM | POA: Diagnosis not present

## 2022-09-21 DIAGNOSIS — E113392 Type 2 diabetes mellitus with moderate nonproliferative diabetic retinopathy without macular edema, left eye: Secondary | ICD-10-CM | POA: Diagnosis not present

## 2022-09-21 DIAGNOSIS — Z419 Encounter for procedure for purposes other than remedying health state, unspecified: Secondary | ICD-10-CM | POA: Diagnosis not present

## 2022-09-21 DIAGNOSIS — E113311 Type 2 diabetes mellitus with moderate nonproliferative diabetic retinopathy with macular edema, right eye: Secondary | ICD-10-CM | POA: Diagnosis not present

## 2022-09-21 DIAGNOSIS — H2513 Age-related nuclear cataract, bilateral: Secondary | ICD-10-CM | POA: Diagnosis not present

## 2022-10-01 DIAGNOSIS — F321 Major depressive disorder, single episode, moderate: Secondary | ICD-10-CM | POA: Diagnosis not present

## 2022-10-06 DIAGNOSIS — Z9889 Other specified postprocedural states: Secondary | ICD-10-CM | POA: Diagnosis not present

## 2022-10-06 DIAGNOSIS — F17200 Nicotine dependence, unspecified, uncomplicated: Secondary | ICD-10-CM | POA: Diagnosis not present

## 2022-10-06 DIAGNOSIS — Z7689 Persons encountering health services in other specified circumstances: Secondary | ICD-10-CM | POA: Diagnosis not present

## 2022-10-06 DIAGNOSIS — M869 Osteomyelitis, unspecified: Secondary | ICD-10-CM | POA: Diagnosis not present

## 2022-10-06 DIAGNOSIS — Z09 Encounter for follow-up examination after completed treatment for conditions other than malignant neoplasm: Secondary | ICD-10-CM | POA: Diagnosis not present

## 2022-10-07 DIAGNOSIS — E785 Hyperlipidemia, unspecified: Secondary | ICD-10-CM | POA: Diagnosis not present

## 2022-10-07 DIAGNOSIS — Z794 Long term (current) use of insulin: Secondary | ICD-10-CM | POA: Diagnosis not present

## 2022-10-07 DIAGNOSIS — E1165 Type 2 diabetes mellitus with hyperglycemia: Secondary | ICD-10-CM | POA: Diagnosis not present

## 2022-10-15 DIAGNOSIS — R928 Other abnormal and inconclusive findings on diagnostic imaging of breast: Secondary | ICD-10-CM | POA: Diagnosis not present

## 2022-10-19 DIAGNOSIS — K635 Polyp of colon: Secondary | ICD-10-CM | POA: Diagnosis not present

## 2022-10-19 DIAGNOSIS — Z7982 Long term (current) use of aspirin: Secondary | ICD-10-CM | POA: Diagnosis not present

## 2022-10-19 DIAGNOSIS — Z87891 Personal history of nicotine dependence: Secondary | ICD-10-CM | POA: Diagnosis not present

## 2022-10-19 DIAGNOSIS — E785 Hyperlipidemia, unspecified: Secondary | ICD-10-CM | POA: Diagnosis not present

## 2022-10-19 DIAGNOSIS — K5730 Diverticulosis of large intestine without perforation or abscess without bleeding: Secondary | ICD-10-CM | POA: Diagnosis not present

## 2022-10-19 DIAGNOSIS — D128 Benign neoplasm of rectum: Secondary | ICD-10-CM | POA: Diagnosis not present

## 2022-10-19 DIAGNOSIS — K219 Gastro-esophageal reflux disease without esophagitis: Secondary | ICD-10-CM | POA: Diagnosis not present

## 2022-10-19 DIAGNOSIS — Z89422 Acquired absence of other left toe(s): Secondary | ICD-10-CM | POA: Diagnosis not present

## 2022-10-19 DIAGNOSIS — Z7689 Persons encountering health services in other specified circumstances: Secondary | ICD-10-CM | POA: Diagnosis not present

## 2022-10-19 DIAGNOSIS — E1143 Type 2 diabetes mellitus with diabetic autonomic (poly)neuropathy: Secondary | ICD-10-CM | POA: Diagnosis not present

## 2022-10-19 DIAGNOSIS — K3184 Gastroparesis: Secondary | ICD-10-CM | POA: Diagnosis not present

## 2022-10-19 DIAGNOSIS — I10 Essential (primary) hypertension: Secondary | ICD-10-CM | POA: Diagnosis not present

## 2022-10-19 DIAGNOSIS — Z1211 Encounter for screening for malignant neoplasm of colon: Secondary | ICD-10-CM | POA: Diagnosis not present

## 2022-10-19 DIAGNOSIS — Z6832 Body mass index (BMI) 32.0-32.9, adult: Secondary | ICD-10-CM | POA: Diagnosis not present

## 2022-10-21 DIAGNOSIS — Z419 Encounter for procedure for purposes other than remedying health state, unspecified: Secondary | ICD-10-CM | POA: Diagnosis not present

## 2022-10-26 DIAGNOSIS — F321 Major depressive disorder, single episode, moderate: Secondary | ICD-10-CM | POA: Diagnosis not present

## 2022-10-28 DIAGNOSIS — E1165 Type 2 diabetes mellitus with hyperglycemia: Secondary | ICD-10-CM | POA: Diagnosis not present

## 2022-10-28 DIAGNOSIS — Z794 Long term (current) use of insulin: Secondary | ICD-10-CM | POA: Diagnosis not present

## 2022-11-18 DIAGNOSIS — E1165 Type 2 diabetes mellitus with hyperglycemia: Secondary | ICD-10-CM | POA: Diagnosis not present

## 2022-11-18 DIAGNOSIS — G63 Polyneuropathy in diseases classified elsewhere: Secondary | ICD-10-CM | POA: Diagnosis not present

## 2022-11-18 DIAGNOSIS — Z794 Long term (current) use of insulin: Secondary | ICD-10-CM | POA: Diagnosis not present

## 2022-11-18 DIAGNOSIS — F321 Major depressive disorder, single episode, moderate: Secondary | ICD-10-CM | POA: Diagnosis not present

## 2022-11-20 DIAGNOSIS — F321 Major depressive disorder, single episode, moderate: Secondary | ICD-10-CM | POA: Diagnosis not present

## 2022-11-20 DIAGNOSIS — F4310 Post-traumatic stress disorder, unspecified: Secondary | ICD-10-CM | POA: Diagnosis not present

## 2022-11-21 DIAGNOSIS — Z419 Encounter for procedure for purposes other than remedying health state, unspecified: Secondary | ICD-10-CM | POA: Diagnosis not present

## 2022-12-14 ENCOUNTER — Emergency Department: Payer: Medicaid Other

## 2022-12-14 ENCOUNTER — Encounter: Payer: Self-pay | Admitting: Radiology

## 2022-12-14 ENCOUNTER — Other Ambulatory Visit: Payer: Self-pay

## 2022-12-14 ENCOUNTER — Observation Stay
Admission: EM | Admit: 2022-12-14 | Discharge: 2022-12-15 | Disposition: A | Discharge status: Home / Self Care | Payer: Medicaid Other | Attending: Internal Medicine | Admitting: Internal Medicine

## 2022-12-14 DIAGNOSIS — E1165 Type 2 diabetes mellitus with hyperglycemia: Secondary | ICD-10-CM

## 2022-12-14 DIAGNOSIS — Z794 Long term (current) use of insulin: Secondary | ICD-10-CM | POA: Diagnosis not present

## 2022-12-14 DIAGNOSIS — Z7982 Long term (current) use of aspirin: Secondary | ICD-10-CM | POA: Diagnosis not present

## 2022-12-14 DIAGNOSIS — E782 Mixed hyperlipidemia: Secondary | ICD-10-CM | POA: Diagnosis not present

## 2022-12-14 DIAGNOSIS — E669 Obesity, unspecified: Secondary | ICD-10-CM | POA: Insufficient documentation

## 2022-12-14 DIAGNOSIS — R197 Diarrhea, unspecified: Secondary | ICD-10-CM | POA: Insufficient documentation

## 2022-12-14 DIAGNOSIS — K920 Hematemesis: Secondary | ICD-10-CM | POA: Diagnosis present

## 2022-12-14 DIAGNOSIS — D72829 Elevated white blood cell count, unspecified: Secondary | ICD-10-CM | POA: Diagnosis not present

## 2022-12-14 DIAGNOSIS — R112 Nausea with vomiting, unspecified: Secondary | ICD-10-CM | POA: Diagnosis not present

## 2022-12-14 DIAGNOSIS — R109 Unspecified abdominal pain: Secondary | ICD-10-CM | POA: Diagnosis not present

## 2022-12-14 DIAGNOSIS — Z7984 Long term (current) use of oral hypoglycemic drugs: Secondary | ICD-10-CM | POA: Insufficient documentation

## 2022-12-14 DIAGNOSIS — K3184 Gastroparesis: Secondary | ICD-10-CM | POA: Diagnosis not present

## 2022-12-14 DIAGNOSIS — N179 Acute kidney failure, unspecified: Secondary | ICD-10-CM

## 2022-12-14 DIAGNOSIS — E1143 Type 2 diabetes mellitus with diabetic autonomic (poly)neuropathy: Principal | ICD-10-CM | POA: Insufficient documentation

## 2022-12-14 DIAGNOSIS — Z79899 Other long term (current) drug therapy: Secondary | ICD-10-CM | POA: Diagnosis not present

## 2022-12-14 DIAGNOSIS — R1084 Generalized abdominal pain: Secondary | ICD-10-CM | POA: Insufficient documentation

## 2022-12-14 DIAGNOSIS — F32A Depression, unspecified: Secondary | ICD-10-CM | POA: Insufficient documentation

## 2022-12-14 HISTORY — DX: Gastroparesis: K31.84

## 2022-12-14 LAB — CBC WITH DIFFERENTIAL/PLATELET
Abs Immature Granulocytes: 0.08 10*3/uL — ABNORMAL HIGH (ref 0.00–0.07)
Abs Immature Granulocytes: 0.12 10*3/uL — ABNORMAL HIGH (ref 0.00–0.07)
Basophils Absolute: 0 10*3/uL (ref 0.0–0.1)
Basophils Absolute: 0 10*3/uL (ref 0.0–0.1)
Basophils Relative: 0 %
Basophils Relative: 0 %
Eosinophils Absolute: 0 10*3/uL (ref 0.0–0.5)
Eosinophils Absolute: 0 10*3/uL (ref 0.0–0.5)
Eosinophils Relative: 0 %
Eosinophils Relative: 0 %
HCT: 45.2 % (ref 36.0–46.0)
HCT: 43 % (ref 36.0–46.0)
Hemoglobin: 14.7 g/dL (ref 12.0–15.0)
Hemoglobin: 13.7 g/dL (ref 12.0–15.0)
Immature Granulocytes: 1 %
Immature Granulocytes: 1 %
Lymphocytes Relative: 14 %
Lymphocytes Relative: 16 %
Lymphs Abs: 2 10*3/uL (ref 0.7–4.0)
Lymphs Abs: 2.3 10*3/uL (ref 0.7–4.0)
MCH: 27 pg (ref 26.0–34.0)
MCH: 26.6 pg (ref 26.0–34.0)
MCHC: 32.5 g/dL (ref 30.0–36.0)
MCHC: 31.9 g/dL (ref 30.0–36.0)
MCV: 83.1 fL (ref 80.0–100.0)
MCV: 83.3 fL (ref 80.0–100.0)
Monocytes Absolute: 0.4 10*3/uL (ref 0.1–1.0)
Monocytes Absolute: 0.6 10*3/uL (ref 0.1–1.0)
Monocytes Relative: 3 %
Monocytes Relative: 4 %
Neutro Abs: 12.2 10*3/uL — ABNORMAL HIGH (ref 1.7–7.7)
Neutro Abs: 11 10*3/uL — ABNORMAL HIGH (ref 1.7–7.7)
Neutrophils Relative %: 82 %
Neutrophils Relative %: 79 %
Platelets: 489 10*3/uL — ABNORMAL HIGH (ref 150–400)
Platelets: 433 10*3/uL — ABNORMAL HIGH (ref 150–400)
RBC: 5.44 MIL/uL — ABNORMAL HIGH (ref 3.87–5.11)
RBC: 5.16 MIL/uL — ABNORMAL HIGH (ref 3.87–5.11)
RDW: 15.1 % (ref 11.5–15.5)
RDW: 15.3 % (ref 11.5–15.5)
WBC: 14.8 10*3/uL — ABNORMAL HIGH (ref 4.0–10.5)
WBC: 14.1 10*3/uL — ABNORMAL HIGH (ref 4.0–10.5)
nRBC: 0 % (ref 0.0–0.2)
nRBC: 0 % (ref 0.0–0.2)

## 2022-12-14 LAB — COMPREHENSIVE METABOLIC PANEL
ALT: 29 U/L (ref 0–44)
AST: 22 U/L (ref 15–41)
Albumin: 4.9 g/dL (ref 3.5–5.0)
Alkaline Phosphatase: 288 U/L — ABNORMAL HIGH (ref 38–126)
Anion gap: 17 — ABNORMAL HIGH (ref 5–15)
BUN: 45 mg/dL — ABNORMAL HIGH (ref 6–20)
CO2: 22 mmol/L (ref 22–32)
Calcium: 10.2 mg/dL (ref 8.9–10.3)
Chloride: 98 mmol/L (ref 98–111)
Creatinine, Ser: 1.47 mg/dL — ABNORMAL HIGH (ref 0.44–1.00)
GFR, Estimated: 43 mL/min — ABNORMAL LOW (ref >60)
Glucose, Bld: 315 mg/dL — ABNORMAL HIGH (ref 70–99)
Potassium: 3.5 mmol/L (ref 3.5–5.1)
Sodium: 137 mmol/L (ref 135–145)
Total Bilirubin: 0.8 mg/dL (ref 0.3–1.2)
Total Protein: 10 g/dL — ABNORMAL HIGH (ref 6.5–8.1)

## 2022-12-14 LAB — CBG MONITORING, ED
Glucose-Capillary: 191 mg/dL — ABNORMAL HIGH (ref 70–99)
Glucose-Capillary: 194 mg/dL — ABNORMAL HIGH (ref 70–99)
Glucose-Capillary: 111 mg/dL — ABNORMAL HIGH (ref 70–99)

## 2022-12-14 LAB — HCG, QUANTITATIVE, PREGNANCY: hCG, Beta Chain, Quant, S: <1 m[IU]/mL (ref <5)

## 2022-12-14 LAB — URINALYSIS, COMPLETE (UACMP) WITH MICROSCOPIC
Bacteria, UA: NONE SEEN
Bilirubin Urine: NEGATIVE
Glucose, UA: >=500 mg/dL — AB
Ketones, ur: 20 mg/dL — AB
Leukocytes,Ua: NEGATIVE
Nitrite: NEGATIVE
Protein, ur: 100 mg/dL — AB
RBC / HPF: >50 RBC/hpf (ref 0–5)
Specific Gravity, Urine: 1.014 (ref 1.005–1.030)
pH: 7 (ref 5.0–8.0)

## 2022-12-14 LAB — TYPE AND SCREEN
ABO/RH(D): A POS
Antibody Screen: NEGATIVE

## 2022-12-14 LAB — LIPASE, BLOOD: Lipase: 51 U/L (ref 11–51)

## 2022-12-14 LAB — GLUCOSE, CAPILLARY: Glucose-Capillary: 103 mg/dL — ABNORMAL HIGH (ref 70–99)

## 2022-12-14 MED ORDER — METOCLOPRAMIDE HCL 5 MG/ML IJ SOLN
10.0000 mg | Freq: Once | INTRAMUSCULAR | Status: AC
  Administered 2022-12-14: 10 mg via INTRAVENOUS
  Filled 2022-12-14: qty 2

## 2022-12-14 MED ORDER — SODIUM CHLORIDE 0.9 % IV SOLN: INTRAVENOUS | Status: DC

## 2022-12-14 MED ORDER — PANTOPRAZOLE SODIUM 40 MG IV SOLR
40.0000 mg | INTRAVENOUS | Status: DC
  Administered 2022-12-14: 40 mg via INTRAVENOUS
  Filled 2022-12-14: qty 10

## 2022-12-14 MED ORDER — MORPHINE SULFATE (PF) 4 MG/ML IV SOLN
4.0000 mg | Freq: Once | INTRAVENOUS | Status: AC
  Administered 2022-12-14: 4 mg via INTRAVENOUS
  Filled 2022-12-14: qty 1

## 2022-12-14 MED ORDER — INSULIN ASPART 100 UNIT/ML IJ SOLN
0.0000 [IU] | Freq: Three times a day (TID) | INTRAMUSCULAR | Status: DC
  Administered 2022-12-14: 3 [IU] via SUBCUTANEOUS
  Filled 2022-12-14: qty 1

## 2022-12-14 MED ORDER — SODIUM CHLORIDE 0.9 % IV BOLUS
1000.0000 mL | Freq: Once | INTRAVENOUS | Status: AC
  Administered 2022-12-14: 1000 mL via INTRAVENOUS

## 2022-12-14 MED ORDER — METOCLOPRAMIDE HCL 5 MG/ML IJ SOLN: 10.0000 mg | Freq: Once | INTRAMUSCULAR | Status: DC

## 2022-12-14 MED ORDER — SODIUM CHLORIDE 0.9 % IV SOLN: 12.5000 mg | Freq: Four times a day (QID) | INTRAVENOUS | Status: DC | PRN

## 2022-12-14 MED ORDER — ENOXAPARIN SODIUM 60 MG/0.6ML IJ SOSY: 0.5000 mg/kg | PREFILLED_SYRINGE | INTRAMUSCULAR | Status: DC

## 2022-12-14 MED ORDER — INSULIN ASPART 100 UNIT/ML IJ SOLN
8.0000 [IU] | Freq: Once | INTRAMUSCULAR | Status: AC
  Administered 2022-12-14: 8 [IU] via SUBCUTANEOUS
  Filled 2022-12-14: qty 1

## 2022-12-14 MED ORDER — SUCRALFATE 1 G PO TABS
1.0000 g | ORAL_TABLET | Freq: Three times a day (TID) | ORAL | Status: DC
  Administered 2022-12-14 – 2022-12-15 (×3): 1 g via ORAL
  Filled 2022-12-14 (×3): qty 1

## 2022-12-14 MED ORDER — INSULIN ASPART 100 UNIT/ML IJ SOLN: 0.0000 [IU] | Freq: Every day | INTRAMUSCULAR | Status: DC

## 2022-12-14 MED ORDER — DROPERIDOL 2.5 MG/ML IJ SOLN
2.5000 mg | Freq: Once | INTRAMUSCULAR | Status: AC
  Administered 2022-12-14: 2.5 mg via INTRAVENOUS
  Filled 2022-12-14: qty 2

## 2022-12-14 MED ORDER — ACETAMINOPHEN 650 MG RE SUPP: 650.0000 mg | Freq: Four times a day (QID) | RECTAL | Status: DC | PRN

## 2022-12-14 MED ORDER — ONDANSETRON HCL 4 MG PO TABS: 4.0000 mg | ORAL_TABLET | Freq: Four times a day (QID) | ORAL | Status: DC | PRN

## 2022-12-14 MED ORDER — ONDANSETRON HCL 4 MG/2ML IJ SOLN: 4.0000 mg | Freq: Four times a day (QID) | INTRAMUSCULAR | Status: DC | PRN

## 2022-12-14 MED ORDER — PANTOPRAZOLE 80MG IVPB - SIMPLE MED
80.0000 mg | Freq: Once | INTRAVENOUS | Status: AC
  Administered 2022-12-14: 80 mg via INTRAVENOUS
  Filled 2022-12-14: qty 100

## 2022-12-14 MED ORDER — SODIUM CHLORIDE 0.9 % IV SOLN
250.0000 mg | Freq: Three times a day (TID) | INTRAVENOUS | Status: DC
  Administered 2022-12-14 – 2022-12-15 (×3): 250 mg via INTRAVENOUS
  Filled 2022-12-14 (×5): qty 5

## 2022-12-14 MED ORDER — ACETAMINOPHEN 325 MG PO TABS: 650.0000 mg | ORAL_TABLET | Freq: Four times a day (QID) | ORAL | Status: DC | PRN

## 2022-12-14 MED ORDER — ONDANSETRON HCL 4 MG/2ML IJ SOLN: 4.0000 mg | Freq: Once | INTRAMUSCULAR | Status: DC

## 2022-12-14 MED ORDER — INSULIN GLARGINE-YFGN 100 UNIT/ML ~~LOC~~ SOLN
25.0000 [IU] | Freq: Every day | SUBCUTANEOUS | Status: DC
  Administered 2022-12-14: 25 [IU] via SUBCUTANEOUS
  Filled 2022-12-14 (×2): qty 0.25

## 2022-12-14 MED ORDER — DIPHENHYDRAMINE HCL 50 MG/ML IJ SOLN: 25.0000 mg | Freq: Once | INTRAMUSCULAR | Status: DC

## 2022-12-14 MED ORDER — IOHEXOL 300 MG/ML  SOLN
80.0000 mL | Freq: Once | INTRAMUSCULAR | Status: AC | PRN
  Administered 2022-12-14: 80 mL via INTRAVENOUS

## 2022-12-14 NOTE — Assessment & Plan Note (Signed)
-   Hold home fluoxetine

## 2022-12-14 NOTE — Hospital Course (Addendum)
Mrs. Palazzo is a 47 y.o. F with IDDM c/b gastroparesis and DFU, hidradenitis suppurativa, obesity, HLD, depression who presented with few days intractable vomiting and abdominal pain.  Recently her pharmacy ran out of Carafate.  Shortly after, she started to have upset stomach, nausea, which spiraled over a few days.  Yesterday, she used THC, which exacerbated the problem so today she was vomiting repeatedly, got lightheaded with standing, and so came to the ER.  In the ER, glucose 315, anion gap elevated, Cr 1.47, WBC 14K.  CT abdomen and pelvis unremarkable.  Given PPI, morphine, IV fluids and hospitalists consulted for admission.

## 2022-12-14 NOTE — Discharge Instructions (Addendum)
Patient to continue her home diabetes meds and adjust dosage of her insulin according to sugars at home.

## 2022-12-14 NOTE — Assessment & Plan Note (Signed)
Baseline creatinine one 1 month ago in Care Everywhere.  Here it is 1.47 on admission.  Likely prerenal. - IV fluids - Avoid nephrotoxins

## 2022-12-14 NOTE — Assessment & Plan Note (Signed)
Hemoglobin A1c 10.1% last January.  Glucose here in the 300s on admission.  Uses Lantus and NovoLog at home. - Resume glargine, lower than home dose until she is taking p.o. - Sliding scale corrections - IV fluids

## 2022-12-14 NOTE — ED Provider Notes (Addendum)
[facility] Provider Note    Event Date/Time   First MD Initiated Contact with Patient 12/14/22 [phone]     (approximate)   History   Hematemesis   HPI  Brenda Mccarthy is a 47 y.o. female   Past medical history of diabetes, diabetic neuropathy, gastroparesis, migraines, major depressive disorder, who presents to the emergency department with abdominal pain, nausea, vomiting and loose stools for the last 4 days. Abd pain seems to come in waves.  She has a subjective fever.  Some chills and dysuria as well.  She states that tonight she has noted specks of blood in her vomit as well as dark stools so came to the emergency department.  Poor po intake iso n/v and has felt lightheaded on standing and has passed out a couple times in the last 2 days.   She uses cannabis and multiple forms including edibles, smoking, vaping.  She stopped using approximately 6 months ago but then restarted 2 weeks ago.  She had been told to stop because it may be related to her vomiting, per her gastroenterologist.  She does not use blood thinners.  She does not use NSAIDs or alcohol. No chest pain, dyspnea, cough.   Independent Historian contributed to assessment above: Her husband is at bedside to corroborate information given above  External Medical Documents Reviewed: [facility] hospital note from 2022 noting EGD in Sept 2022  Showed esophagitis & duodenitis with "nummular stomach lesion" that was biopsied       Physical Exam   Triage Vital Signs: ED Triage Vitals  Enc Vitals Group     BP 12/14/22 0535 (!) 200/98     Pulse Rate 12/14/22 0535 98     Resp 12/14/22 0535 17     Temp 12/14/22 0535 (!) 97.5 F (36.4 C)     Temp Source 12/14/22 0535 Oral     SpO2 12/14/22 0535 100 %     Weight 12/14/22 0536 220 lb (99.8 kg)     Height 12/14/22 0536 5\' 9"  (1.753 m)     Head Circumference --      Peak Flow --      Pain Score --      Pain Loc --      Pain Edu? --       Excl. in GC? --     Most recent vital signs: Vitals:   12/14/22 0535 12/14/22 0635  BP: (!) 200/98 (!) 190/97  Pulse: 98 93  Resp: 17 18  Temp: (!) 97.5 F (36.4 C) 97.7 F (36.5 C)  SpO2: 100% 98%    General: Awake, she appears uncomfortable, moaning.  CV:  Good peripheral perfusion.  Resp:  Normal effort.  Abd:  No distention. She winces in pain and cries out when I palpate any quadrant of the abdomen. It is soft w/o rigidity. Rectal w no blood or melena, brown formed stool which is guiac negative. At times appears comfortable then will have periods of time where she doubles over in pain and dry heaves.    ED Results / Procedures / Treatments   Labs (all labs ordered are listed, but only abnormal results are displayed) Labs Reviewed  CBC WITH DIFFERENTIAL/PLATELET - Abnormal; Notable for the following components:      Result Value   WBC 14.8 (*)    RBC 5.44 (*)    Platelets 489 (*)    Neutro Abs 12.2 (*)    Abs Immature Granulocytes 0.08 (*)  All other components within normal limits  COMPREHENSIVE METABOLIC PANEL - Abnormal; Notable for the following components:   Glucose, Bld 315 (*)    BUN 45 (*)    Creatinine, Ser 1.47 (*)    Total Protein 10.0 (*)    Alkaline Phosphatase 288 (*)    GFR, Estimated 43 (*)    Anion gap 17 (*)    All other components within normal limits  LIPASE, BLOOD  URINALYSIS, COMPLETE (UACMP) WITH MICROSCOPIC  HCG, QUANTITATIVE, PREGNANCY  TYPE AND SCREEN     I ordered and reviewed the above labs they are notable for WBC 16 though appears chronically elevated compared to multiple in the past. H&H stable.   ED ECG REPORT I, Pilar Jarvis, the attending physician, personally viewed and interpreted this ECG.   Date: 12/14/2022  EKG Time: 0631  Rate: 97  Rhythm: sinus  Axis: nl  Intervals:none  ST&T Change: no stemi    PROCEDURES:  Critical Care performed: No  Ultrasound ED Peripheral IV (Provider)  Date/Time: 12/14/2022 6:12  AM  Performed by: Pilar Jarvis, MD Authorized by: Pilar Jarvis, MD   Procedure details:    Indications: multiple failed IV attempts     Skin Prep: chlorhexidine gluconate     Location:  Left AC   Angiocath:  20 G   Bedside Ultrasound Guided: Yes     Images: not archived     Patient tolerated procedure without complications: Yes     Dressing applied: Yes      MEDICATIONS ORDERED IN ED: Medications  pantoprazole (PROTONIX) 80 mg /NS 100 mL IVPB (80 mg Intravenous New Bag/Given 12/14/22 1610)  sodium chloride 0.9 % bolus 1,000 mL (1,000 mLs Intravenous New Bag/Given 12/14/22 9604)  morphine (PF) 4 MG/ML injection 4 mg (4 mg Intravenous Given 12/14/22 0625)  droperidol (INAPSINE) 2.5 MG/ML injection 2.5 mg (2.5 mg Intravenous Given 12/14/22 5409)    IMPRESSION / MDM / ASSESSMENT AND PLAN / ED COURSE  I reviewed the triage vital signs and the nursing notes.                                Patient's presentation is most consistent with acute presentation with potential threat to life or bodily function.  Differential diagnosis includes, but is not limited to intraabdominal infx like cholecystitis, appendicitis, ruptured viscous, GI bleeding, ulcer, pancreatitis, gastroenteritis, gastroparesis, cannabis hyperemesis, UTI   The patient is on the cardiac monitor to evaluate for evidence of arrhythmia and/or significant heart rate changes.  MDM:    Hx gastroparesis and cannabis use, here with abd pain, n/v/d and ?GI bleeding - reported specks of blood in emesis; neg rectal exam for blood/melena/guiac neg.   Get CT abd pelvis r/o surgical abd like perf viscous or infx given tenderness. Periodic waves of pain would be a curious presentation for aaa or ischemia.   Check labs for anemia iso gi bleed.   Check UA for uti given dysuria.   Medicate w droperidol, fluids, morphine.   Disposition pending results of testing as above.    --- WBC chronically high 16 w/ normal stable H&H. BP was  high 200 systolic I think driven by pain bc with moderate improvement of symptoms w droperidol/morphine her BP is now 160s resting more comfortably.   At signout, remainder of lab testing and CT imaging is pending.       FINAL CLINICAL IMPRESSION(S) / ED DIAGNOSES  Final diagnoses:  Generalized abdominal pain  Nausea vomiting and diarrhea     Rx / DC Orders   ED Discharge Orders     None        Note:  This document was prepared using Dragon voice recognition software and may include unintentional dictation errors.       Pilar Jarvis, MD 12/14/22 [phone]

## 2022-12-14 NOTE — Assessment & Plan Note (Signed)
On statin at home - Resume statin when med rec completed

## 2022-12-14 NOTE — ED Provider Notes (Signed)
-----------------------------------------   12:11 PM on 12/14/2022 -----------------------------------------  I took over care on this patient from Dr. Modesto Charon.  CT showed no acute findings.  The patient had been doing better for several hours but then had recurrent vomiting and pain.  Given the prolonged course of her symptoms I recommended admission for further workup and treatment.  The patient has not had any further blood in the vomitus while here.  Hemoglobin is stable.  I consulted Dr. Maryfrances Bunnell from the hospitalist service; based on our discussion he agrees to evaluate the patient for admission.   Dionne Bucy, MD 12/14/22 1212

## 2022-12-14 NOTE — Assessment & Plan Note (Signed)
-   IVF - Clears as able - Start erythromycin - PRN ondansetron or Phenergan - Avoid opiates - Continue home PPI as IV - Resume home Sucralfate

## 2022-12-14 NOTE — H&P (Signed)
History and Physical    Patient: Brenda Mccarthy WGN:[id number] DOB: 1975-10-17 DOA: 12/14/2022 DOS: the patient was seen and examined on 12/14/2022 PCP: Cleon Dew, FNP  Patient coming from: Home  Chief Complaint:  Chief Complaint  Patient presents with   Hematemesis       HPI:  Brenda Mccarthy is a 47 y.o. F with IDDM c/b gastroparesis and DFU, hidradenitis suppurativa, obesity, HLD, depression who presented with few days intractable vomiting and abdominal pain.  Recently her pharmacy ran out of Carafate.  Shortly after, she started to have upset stomach, nausea, which spiraled over a few days.  Yesterday, she used THC, which exacerbated the problem so today she was vomiting repeatedly, got lightheaded with standing, and so came to the ER.  In the ER, glucose 315, anion gap elevated, Cr 1.47, WBC 14K.  CT abdomen and pelvis unremarkable.  Given PPI, morphine, IV fluids and hospitalists consulted for admission.      Review of Systems  Constitutional:  Positive for chills and malaise/fatigue. Negative for fever.  Respiratory:  Negative for cough and sputum production.   Gastrointestinal:  Positive for abdominal pain, nausea and vomiting. Negative for blood in stool, constipation, diarrhea and melena.       Hematemesis, flecked with blood after repeated vomiting  Genitourinary:  Negative for dysuria, frequency and urgency.  Neurological:  Positive for dizziness.  All other systems reviewed and are negative.    Past Medical History:  Diagnosis Date   Diabetes mellitus without complication (HCC)    Diabetic neuropathy (HCC)    Gastroparesis    Hydradenitis    Migraine    Past Surgical History:  Procedure Laterality Date   ANTERIOR CRUCIATE LIGAMENT REPAIR     TUBAL LIGATION     Social History: Lives with her daughter, does not smoke.  Not working currently, working on disability.  Uses THC occasionally in the form of smoking.  No other alcohol or illicit  drugs.  Allergies  Allergen Reactions   Bee Venom Anaphylaxis   Metformin Diarrhea and Nausea And Vomiting   Other     Throat swelling with bleach    Family History  Problem Relation Age of Onset   Bipolar disorder Mother    Diabetes Mother    Heart disease Mother     Prior to Admission medications   Medication Sig Start Date End Date Taking? Authorizing Provider  aspirin EC 81 MG tablet Take 81 mg by mouth daily.    [provider]  aspirin-acetaminophen-caffeine (EXCEDRIN MIGRAINE) [phone] MG tablet Take by mouth every 6 (six) hours as needed for headache.    [provider]  cetirizine (ZYRTEC) 10 MG tablet Take 10 mg by mouth daily.    [provider]  dicyclomine (BENTYL) 20 MG tablet Take 1 tablet (20 mg total) by mouth every 8 (eight) hours as needed for spasms (Abdominal cramping). 12/07/20   Jene Every, MD  DULoxetine (CYMBALTA) 60 MG capsule Take 1 capsule (60 mg total) by mouth daily for 14 days. 09/03/20 09/17/20  Concha Se, MD  fluconazole (DIFLUCAN) 150 MG tablet Take one now and one in a week, repeat weekly as needed for yeast 10/15/17   Fisher, Roselyn Bering, PA-C  gabapentin (NEURONTIN) 300 MG capsule Take 300 mg by mouth daily as needed.    [provider]  glipiZIDE (GLUCOTROL) 5 MG tablet Take 1 tablet (5 mg total) by mouth daily before breakfast for 14 days. 09/03/20 09/17/20  Artis Delay  E, MD  insulin NPH-regular Human (NOVOLIN 70/30) (70-30) 100 UNIT/ML injection Inject 18 Units into the skin 2 (two) times daily with a meal. 08/21/16   Sainani, Rolly Pancake, MD  lisinopril (ZESTRIL) 5 MG tablet Take 1 tablet (5 mg total) by mouth daily for 14 days. 09/03/20 09/17/20  Concha Se, MD  metoCLOPramide (REGLAN) 10 MG tablet Take 1 tablet (10 mg total) by mouth every 6 (six) hours as needed for nausea. 12/05/20 01/04/21  Chesley Noon, MD  ondansetron (ZOFRAN ODT) 4 MG disintegrating tablet Take 1 tablet (4 mg total) by mouth every 8  (eight) hours as needed for nausea or vomiting. 09/03/20   Concha Se, MD  oxyCODONE-acetaminophen (PERCOCET) 7.5-325 MG tablet Take 1 tablet by mouth every 6 (six) hours as needed. 05/13/19   Joni Reining, PA-C  potassium chloride (KLOR-CON) 20 MEQ packet Take 40 mEq by mouth daily. 12/07/20   Jene Every, MD  silver sulfADIAZINE (SILVADENE) 1 % cream Apply 1 application topically daily as needed.    [provider]  sodium chloride (OCEAN) 0.65 % SOLN nasal spray Place 1 spray into both nostrils as needed for congestion.    [provider]    Physical Exam: Vitals:   12/14/22 0635 12/14/22 0730 12/14/22 1130 12/14/22 1200  BP: (!) 190/97 (!) 161/85 104/71 (!) 133/108  Pulse: 93 90 93 (!) 102  Resp: 18 18 20    Temp: 97.7 F (36.5 C)     TempSrc: Oral     SpO2: 98% 93% 93% 97%  Weight:      Height:       General appearance: Alert adult female, lying in bed, responds appropriate to questions, eye contact, dress and hygiene appropriate. HEENT: Anicteric, conjunctival pink, lids and lashes normal.  Oropharynx tacky dry, visual tracking smooth, lips normal, dentition normal, normal auditory acuity.    Cor: RRR, no murmurs, JVP normal, no lower extremity edema Resp: Normal respiratory rate and rhythm, lungs clear without rales or wheezes Abd: No ascites or distention.  She has visible guarding and so exam was deferred. MSK: Symmetrical without gross deformities of the hands, large joints or legs Skin: Cap refill normal, skin intact without significant rashes or lesions Neuro: Speech fluent, naming grossly intact, patient's recall (both recent and remote) as well as general fund of knowledge seems normal.  Face symmetric, moves all extremities with normal strength and coordination. Psych:  Attention span and concentration are within normal limits.  Affect normal.  appropriate thought content and normal rate of speech, thought process linear           Data  Reviewed: Basic metabolic panel shows elevated creatinine, hyperglycemia, mild elevation in anion gap. CBC shows a leukocytosis Urinalysis with hemoglobin, glucose Pregnancy test negative Lipase normal CT of the abdomen and pelvis with IV contrast shows no acute findings. ECG, personally reviewed, shows sinus tachycardia Gastric emptying study from December 2022 shows delayed gastric emptying     Assessment and Plan: * Diabetic gastroparesis (HCC) - IVF - Clears as able - Start erythromycin - PRN ondansetron or Phenergan - Avoid opiates - Continue home PPI as IV - Resume home Sucralfate    AKI (acute kidney injury) (HCC) Baseline creatinine one 1 month ago in Care Everywhere.  Here it is 1.47 on admission.  Likely prerenal. - IV fluids - Avoid nephrotoxins  Depression - Hold home fluoxetine    Mixed hyperlipidemia On statin at home - Resume statin when med rec completed  Obesity (BMI 30-39.9) BMI 32.4  Uncontrolled type 2 diabetes mellitus with hyperglycemia, with long-term current use of insulin (HCC) Hemoglobin A1c 10.1% last January.  Glucose here in the 300s on admission.  Uses Lantus and NovoLog at home. - Resume glargine, lower than home dose until she is taking p.o. - Sliding scale corrections - IV fluids   Elevated anion gap Probably from vomiting, doubt DKA.  Safe for resumption of subcutaneous insulin.  Leukocytosis and abdominal pain CT of the abdomen and pelvis does not support the diagnosis of cholecystitis, appendicitis, ruptured intestines, pancreatitis, gastroenteritis, or urinary tract infection.  Likely reactive from gastroparesis flare.       Advance Care Planning: FULL  Consults: None  Family Communication: None  Severity of Illness: The appropriate patient status for this patient is OBSERVATION. Observation status is judged to be reasonable and necessary in order to provide the required intensity of service to ensure the  patient's safety. The patient's presenting symptoms, physical exam findings, and initial radiographic and laboratory data in the context of their medical condition is felt to place them at decreased risk for further clinical deterioration. Furthermore, it is anticipated that the patient will be medically stable for discharge from the hospital within 2 midnights of admission.   Author: Alberteen Sam, MD 12/14/2022 1:06 PM  For on call review [email or web address].

## 2022-12-14 NOTE — Assessment & Plan Note (Signed)
BMI 32.4

## 2022-12-14 NOTE — ED Notes (Signed)
Pt given meal tray.

## 2022-12-14 NOTE — ED Triage Notes (Signed)
Pt sts that she has been vomiting since Friday and sts that tonight there started being blood in the vomit. Pt sts that she has also been having diarrhea.

## 2022-12-15 DIAGNOSIS — K3184 Gastroparesis: Secondary | ICD-10-CM | POA: Diagnosis not present

## 2022-12-15 DIAGNOSIS — E1143 Type 2 diabetes mellitus with diabetic autonomic (poly)neuropathy: Secondary | ICD-10-CM | POA: Diagnosis not present

## 2022-12-15 LAB — COMPREHENSIVE METABOLIC PANEL
ALT: 19 U/L (ref 0–44)
AST: 16 U/L (ref 15–41)
Albumin: 3.5 g/dL (ref 3.5–5.0)
Alkaline Phosphatase: 189 U/L — ABNORMAL HIGH (ref 38–126)
Anion gap: 8 (ref 5–15)
BUN: 23 mg/dL — ABNORMAL HIGH (ref 6–20)
CO2: 25 mmol/L (ref 22–32)
Calcium: 8.7 mg/dL — ABNORMAL LOW (ref 8.9–10.3)
Chloride: 103 mmol/L (ref 98–111)
Creatinine, Ser: 1.09 mg/dL — ABNORMAL HIGH (ref 0.44–1.00)
GFR, Estimated: >60 mL/min (ref >60)
Glucose, Bld: 112 mg/dL — ABNORMAL HIGH (ref 70–99)
Potassium: 3.5 mmol/L (ref 3.5–5.1)
Sodium: 136 mmol/L (ref 135–145)
Total Bilirubin: 0.4 mg/dL (ref 0.3–1.2)
Total Protein: 7.1 g/dL (ref 6.5–8.1)

## 2022-12-15 LAB — CBC
HCT: 37.4 % (ref 36.0–46.0)
Hemoglobin: 11.8 g/dL — ABNORMAL LOW (ref 12.0–15.0)
MCH: 27.1 pg (ref 26.0–34.0)
MCHC: 31.6 g/dL (ref 30.0–36.0)
MCV: 86 fL (ref 80.0–100.0)
Platelets: 369 10*3/uL (ref 150–400)
RBC: 4.35 MIL/uL (ref 3.87–5.11)
RDW: 15.5 % (ref 11.5–15.5)
WBC: 10.5 10*3/uL (ref 4.0–10.5)
nRBC: 0 % (ref 0.0–0.2)

## 2022-12-15 LAB — GLUCOSE, CAPILLARY
Glucose-Capillary: 99 mg/dL (ref 70–99)
Glucose-Capillary: 205 mg/dL — ABNORMAL HIGH (ref 70–99)

## 2022-12-15 LAB — HIV ANTIBODY (ROUTINE TESTING W REFLEX): HIV Screen 4th Generation wRfx: NONREACTIVE

## 2022-12-15 LAB — HEMOGLOBIN A1C
Hgb A1c MFr Bld: 8.5 % — ABNORMAL HIGH (ref 4.8–5.6)
Mean Plasma Glucose: 197.25 mg/dL

## 2022-12-15 MED ORDER — INSULIN GLARGINE 100 UNIT/ML ~~LOC~~ SOLN: 58.0000 [IU] | Freq: Every day | SUBCUTANEOUS | 11 refills | Status: DC

## 2022-12-15 NOTE — Discharge Summary (Signed)
Physician Discharge Summary   Patient: Brenda Mccarthy MRN: [id number] DOB: Oct 25, 1975  Admit date:     12/14/2022  Discharge date: 12/15/2022  Discharge Physician: Enedina Finner   PCP: Cleon Dew, FNP   Recommendations at discharge:    F/u PCP in 1 week  Discharge Diagnoses: Principal Problem:   Diabetic gastroparesis (HCC) Active Problems:   AKI (acute kidney injury) (HCC)   Uncontrolled type 2 diabetes mellitus with hyperglycemia, with long-term current use of insulin (HCC)   Obesity (BMI 30-39.9)   Mixed hyperlipidemia   Depression   [name] is a 47 y.o. F with IDDM c/b gastroparesis and DFU, hidradenitis suppurativa, obesity, HLD, depression who presented with few days intractable vomiting and abdominal pain.   Recently her pharmacy ran out of Carafate.  Shortly after, she started to have upset stomach, nausea, which spiraled over a few days.  Yesterday, she used THC, which exacerbated the problem so today she was vomiting repeatedly, got lightheaded with standing, and so came to the ER.  Diabetic gastroparesis (HCC) - IVF - Clears as able--advanced to soft diet - PRN ondansetron or Phenergan - Avoid opiates - Continue home PPI as IV - Resume home Sucralfate --no vomting   AKI (acute kidney injury) (HCC) Baseline creatinine one 1 month ago in Care Everywhere.  Here it is 1.47 on admission.  Likely prerenal. - IV fluids - Avoid nephrotoxins -creat down to 1.09   Depression - resume your meds   Mixed hyperlipidemia On statin at home  Obesity (BMI 30-39.9) BMI 32.4   Uncontrolled type 2 diabetes mellitus with hyperglycemia, with long-term current use of insulin (HCC) Hemoglobin A1c 10.1% last January.  Glucose here in the 300s on admission.  Uses Lantus and NovoLog at home. - Resume glargine and her home meds at d/c - Sliding scale corrections   Leukocytosis and abdominal pain CT of the abdomen and pelvis does not support the diagnosis of  cholecystitis, appendicitis, ruptured intestines, pancreatitis, gastroenteritis, or urinary tract infection.  Likely reactive from gastroparesis flare. --wbc down to 10.5 k  Overall back to baseline. Will discharge to home. Patient agreeable             Advance Care Planning: FULL   Consults: None      Diet recommendation:  Discharge Diet Orders (From admission, onward)     Start     Ordered   12/15/22 0000  Diet Carb Modified        12/15/22 1011            DISCHARGE MEDICATION: Allergies as of 12/15/2022       Reactions   Bee Venom Anaphylaxis   Metformin Diarrhea, Nausea And Vomiting   Other    Throat swelling with bleach        Medication List     TAKE these medications    aspirin 81 MG chewable tablet Chew 81 mg by mouth daily.   atorvastatin 40 MG tablet Commonly known as: LIPITOR Take 40 mg by mouth daily.   cetirizine 10 MG tablet Commonly known as: ZYRTEC Take 10 mg by mouth daily.   diphenhydrAMINE 25 mg capsule Commonly known as: BENADRYL Take 25 mg by mouth every 6 (six) hours as needed for allergies.   FLUoxetine 40 MG capsule Commonly known as: PROZAC Take 40 mg by mouth daily.   insulin aspart 100 UNIT/ML injection Commonly known as: novoLOG Inject 8 Units into the skin 2 (two) times daily.   insulin glargine 100  UNIT/ML injection Commonly known as: LANTUS Inject 0.58 mLs (58 Units total) into the skin at bedtime.   liraglutide 18 MG/3ML Sopn Commonly known as: VICTOZA Inject 1.8 mg into the skin daily.   methocarbamol 750 MG tablet Commonly known as: ROBAXIN Take 750 mg by mouth 2 (two) times daily.   metoCLOPramide 10 MG tablet Commonly known as: REGLAN Take 1 tablet (10 mg total) by mouth every 6 (six) hours as needed for nausea. What changed: when to take this   pantoprazole 40 MG tablet Commonly known as: PROTONIX Take 40 mg by mouth 2 (two) times daily.   sucralfate 1 g tablet Commonly known as:  CARAFATE Take 1 g by mouth 4 (four) times daily.   traZODone 50 MG tablet Commonly known as: DESYREL Take 50 mg by mouth at bedtime as needed for sleep.        Follow-up Information     Cleon Dew, FNP. Schedule an appointment as soon as possible for a visit in 1 week(s).   Specialty: Family Medicine Why: hospital f/u Contact information: [address] [place] [address] [phone]                 Condition at discharge:   The results of significant diagnostics from this hospitalization (including imaging, microbiology, ancillary and laboratory) are listed below for reference.   Imaging Studies: CT ABDOMEN PELVIS W CONTRAST  Result Date: 12/14/2022 CLINICAL DATA:  Abdominal pain, vomiting EXAM: CT ABDOMEN AND PELVIS WITH CONTRAST TECHNIQUE: Multidetector CT imaging of the abdomen and pelvis was performed using the standard protocol following bolus administration of intravenous contrast. RADIATION DOSE REDUCTION: This exam was performed according to the departmental dose-optimization program which includes automated exposure control, adjustment of the mA and/or kV according to patient size and/or use of iterative reconstruction technique. CONTRAST:  80mL OMNIPAQUE IOHEXOL 300 MG/ML  SOLN COMPARISON:  Prior CT scan of the abdomen and pelvis 12/05/2020 FINDINGS: Lower chest: No acute abnormality. Hepatobiliary: No focal liver abnormality is seen. No gallstones, gallbladder wall thickening, or biliary dilatation. Pancreas: Unremarkable. No pancreatic ductal dilatation or surrounding inflammatory changes. Spleen: Normal in size without focal abnormality. Adrenals/Urinary Tract: Adrenal glands are unremarkable. Kidneys are normal, without renal calculi, focal lesion, or hydronephrosis. Bladder is unremarkable. Stomach/Bowel: Stomach is within normal limits. Appendix appears normal. No evidence of bowel wall thickening, distention, or inflammatory changes.  Vascular/Lymphatic: Trace atherosclerotic plaque along the abdominal aorta. No aneurysmal dissection. No suspicious lymphadenopathy. Reproductive: Uterus and bilateral adnexa are unremarkable. Other: No abdominal wall hernia or abnormality. No abdominopelvic ascites. Musculoskeletal: No acute or significant osseous findings. Focal degenerative disc disease at L5-S1. IMPRESSION: 1. No acute abnormality within the abdomen or pelvis. 2. Trace atherosclerotic plaque along the abdominal aorta. Aortic Atherosclerosis (ICD10-I70.0). 3. Focal L5-S1 degenerative disc disease. Electronically Signed   By: Malachy Moan M.D.   On: 12/14/2022 08:09    Microbiology: Results for orders placed or performed during the hospital encounter of 08/20/16  Urine culture     Status: Abnormal   Collection Time: 08/20/16 11:40 AM   Specimen: Urine, Clean Catch  Result Value Ref Range Status   Specimen Description URINE, CLEAN CATCH  Final   Special Requests NONE  Final   Culture 50,000 COLONIES/mL ESCHERICHIA COLI (A)  Final   Report Status 08/22/2016 FINAL  Final   Organism ID, Bacteria ESCHERICHIA COLI (A)  Final      Susceptibility   Escherichia coli - MIC*    AMPICILLIN >=32  RESISTANT Resistant     CEFAZOLIN <=4 SENSITIVE Sensitive     CEFTRIAXONE <=1 SENSITIVE Sensitive     CIPROFLOXACIN <=0.25 SENSITIVE Sensitive     GENTAMICIN <=1 SENSITIVE Sensitive     IMIPENEM <=0.25 SENSITIVE Sensitive     NITROFURANTOIN <=16 SENSITIVE Sensitive     TRIMETH/SULFA <=20 SENSITIVE Sensitive     AMPICILLIN/SULBACTAM 16 INTERMEDIATE Intermediate     PIP/TAZO <=4 SENSITIVE Sensitive     Extended ESBL NEGATIVE Sensitive     * 50,000 COLONIES/mL ESCHERICHIA COLI  CULTURE, BLOOD (ROUTINE X 2) w Reflex to ID Panel     Status: None   Collection Time: 08/20/16 11:40 AM   Specimen: BLOOD  Result Value Ref Range Status   Specimen Description BLOOD LEFT AC  Final   Special Requests BOTTLES DRAWN AEROBIC AND ANAEROBIC BCHV   Final   Culture NO GROWTH 5 DAYS  Final   Report Status 08/25/2016 FINAL  Final  CULTURE, BLOOD (ROUTINE X 2) w Reflex to ID Panel     Status: None   Collection Time: 08/20/16 11:41 AM   Specimen: BLOOD  Result Value Ref Range Status   Specimen Description BLOOD RIGHT AC  Final   Special Requests BOTTLES DRAWN AEROBIC AND ANAEROBIC BCAV  Final   Culture NO GROWTH 5 DAYS  Final   Report Status 08/25/2016 FINAL  Final    Labs: CBC: Recent Labs  Lab 12/14/22 0609 12/14/22 1030 12/15/22 0406  WBC 14.8* 14.1* 10.5  NEUTROABS 12.2* 11.0*  --   HGB 14.7 13.7 11.8*  HCT 45.2 43.0 37.4  MCV 83.1 83.3 86.0  PLT 489* 433* 369   Basic Metabolic Panel: Recent Labs  Lab 12/14/22 0609 12/15/22 0406  NA 137 136  K 3.5 3.5  CL 98 103  CO2 22 25  GLUCOSE 315* 112*  BUN 45* 23*  CREATININE 1.47* 1.09*  CALCIUM 10.2 8.7*   Liver Function Tests: Recent Labs  Lab 12/14/22 0609 12/15/22 0406  AST 22 16  ALT 29 19  ALKPHOS 288* 189*  BILITOT 0.8 0.4  PROT 10.0* 7.1  ALBUMIN 4.9 3.5   CBG: Recent Labs  Lab 12/14/22 1344 12/14/22 1624 12/14/22 2100 12/14/22 2205 12/15/22 0814  GLUCAP 191* 194* 111* 103* 99    Discharge time spent:  30 minutes.  Signed: Enedina Finner, MD [facility] 12/15/2022

## 2022-12-15 NOTE — Plan of Care (Signed)
  Problem: Clinical Measurements: Goal: Ability to maintain clinical measurements within normal limits will improve Outcome: Progressing Goal: Will remain free from infection Outcome: Progressing Goal: Diagnostic test results will improve Outcome: Progressing Goal: Respiratory complications will improve Outcome: Progressing Goal: Cardiovascular complication will be avoided Outcome: Progressing   Problem: Activity: Goal: Risk for activity intolerance will decrease Outcome: Progressing   Problem: Coping: Goal: Level of anxiety will decrease Outcome: Progressing   Problem: Pain Managment: Goal: General experience of comfort will improve Outcome: Progressing   Problem: Safety: Goal: Ability to remain free from injury will improve Outcome: Progressing   Problem: Skin Integrity: Goal: Risk for impaired skin integrity will decrease Outcome: Progressing   Problem: Skin Integrity: Goal: Risk for impaired skin integrity will decrease Outcome: Progressing   Problem: Tissue Perfusion: Goal: Adequacy of tissue perfusion will improve Outcome: Progressing

## 2022-12-18 DIAGNOSIS — R1084 Generalized abdominal pain: Secondary | ICD-10-CM | POA: Diagnosis not present

## 2022-12-18 DIAGNOSIS — R112 Nausea with vomiting, unspecified: Secondary | ICD-10-CM | POA: Diagnosis not present

## 2022-12-18 DIAGNOSIS — Z79899 Other long term (current) drug therapy: Secondary | ICD-10-CM | POA: Diagnosis not present

## 2022-12-18 DIAGNOSIS — Z7982 Long term (current) use of aspirin: Secondary | ICD-10-CM | POA: Diagnosis not present

## 2022-12-18 DIAGNOSIS — K3184 Gastroparesis: Secondary | ICD-10-CM | POA: Diagnosis not present

## 2022-12-18 DIAGNOSIS — Z87891 Personal history of nicotine dependence: Secondary | ICD-10-CM | POA: Diagnosis not present

## 2022-12-18 DIAGNOSIS — I10 Essential (primary) hypertension: Secondary | ICD-10-CM | POA: Diagnosis not present

## 2022-12-18 DIAGNOSIS — I7090 Unspecified atherosclerosis: Secondary | ICD-10-CM | POA: Diagnosis not present

## 2022-12-18 DIAGNOSIS — E1110 Type 2 diabetes mellitus with ketoacidosis without coma: Secondary | ICD-10-CM | POA: Diagnosis not present

## 2022-12-18 DIAGNOSIS — R109 Unspecified abdominal pain: Secondary | ICD-10-CM | POA: Diagnosis not present

## 2022-12-18 DIAGNOSIS — E785 Hyperlipidemia, unspecified: Secondary | ICD-10-CM | POA: Diagnosis not present

## 2022-12-21 DIAGNOSIS — Z419 Encounter for procedure for purposes other than remedying health state, unspecified: Secondary | ICD-10-CM | POA: Diagnosis not present

## 2023-01-04 DIAGNOSIS — E785 Hyperlipidemia, unspecified: Secondary | ICD-10-CM | POA: Diagnosis not present

## 2023-01-04 DIAGNOSIS — E1165 Type 2 diabetes mellitus with hyperglycemia: Secondary | ICD-10-CM | POA: Diagnosis not present

## 2023-01-04 DIAGNOSIS — R7989 Other specified abnormal findings of blood chemistry: Secondary | ICD-10-CM | POA: Diagnosis not present

## 2023-01-04 DIAGNOSIS — G63 Polyneuropathy in diseases classified elsewhere: Secondary | ICD-10-CM | POA: Diagnosis not present

## 2023-01-04 DIAGNOSIS — F321 Major depressive disorder, single episode, moderate: Secondary | ICD-10-CM | POA: Diagnosis not present

## 2023-01-04 DIAGNOSIS — Z794 Long term (current) use of insulin: Secondary | ICD-10-CM | POA: Diagnosis not present

## 2023-01-04 DIAGNOSIS — K3184 Gastroparesis: Secondary | ICD-10-CM | POA: Diagnosis not present

## 2023-01-04 DIAGNOSIS — E1143 Type 2 diabetes mellitus with diabetic autonomic (poly)neuropathy: Secondary | ICD-10-CM | POA: Diagnosis not present

## 2023-01-21 DIAGNOSIS — Z419 Encounter for procedure for purposes other than remedying health state, unspecified: Secondary | ICD-10-CM | POA: Diagnosis not present

## 2023-01-26 IMAGING — DX DG FOOT COMPLETE 3+V*L*
3 series · 3 of 3 positions shown · non-contrast
Comparison: None

CLINICAL DATA: Foot ulcer, diabetes mellitus

EXAM:
LEFT FOOT - COMPLETE 3+ VIEW

[foot ap]
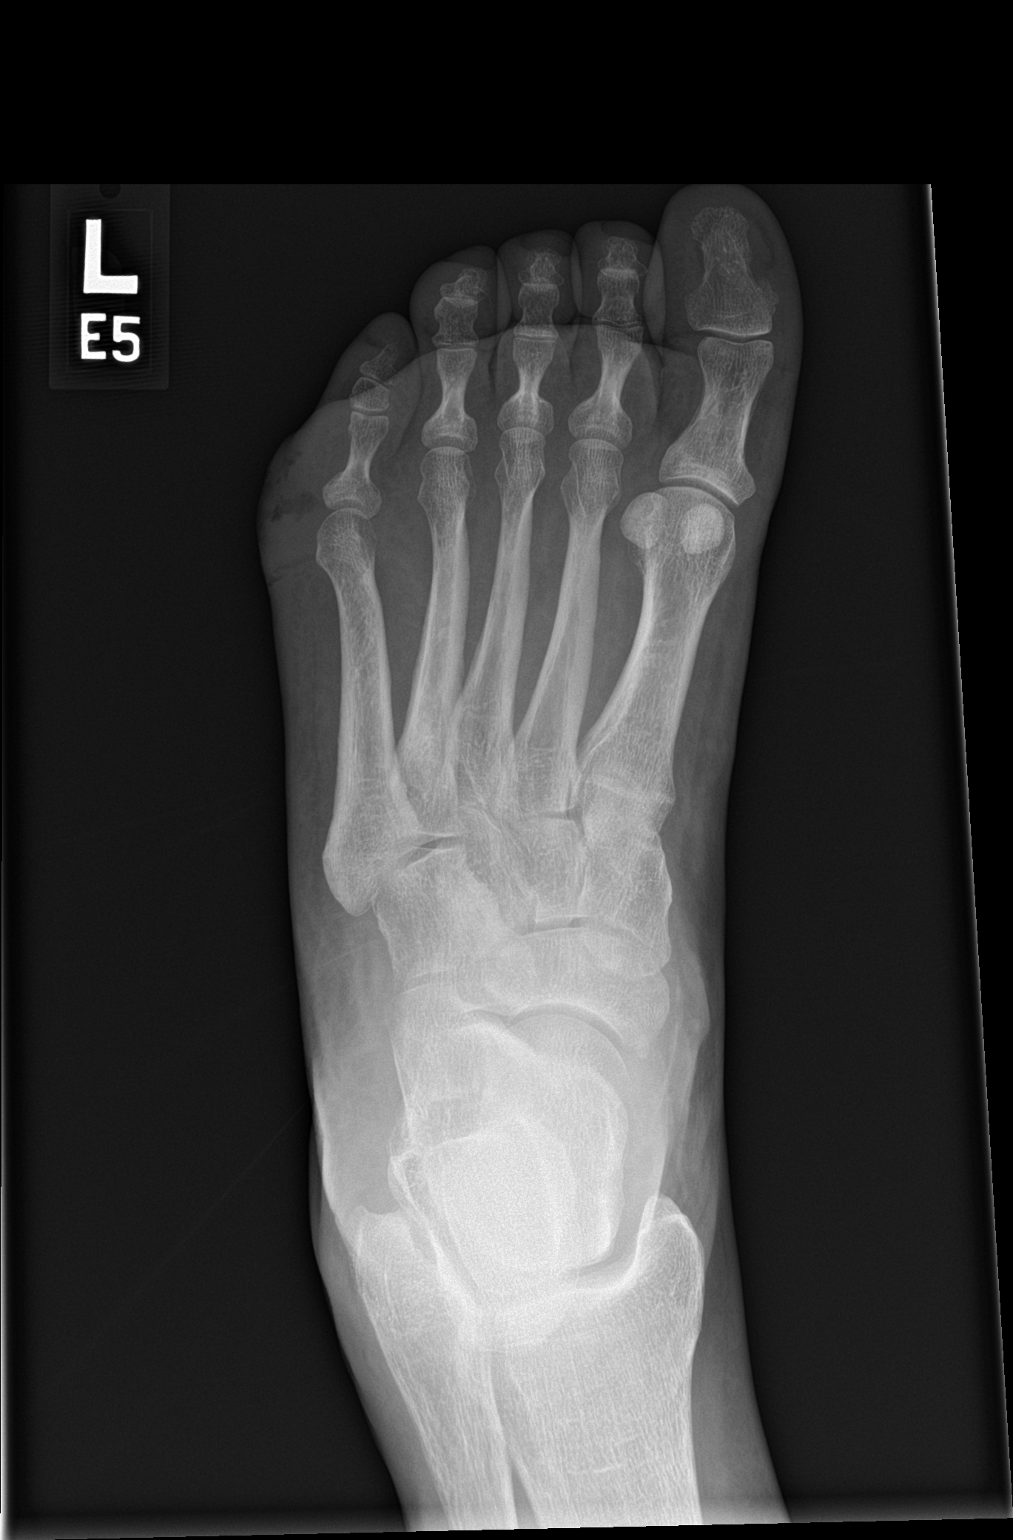

[foot obl]
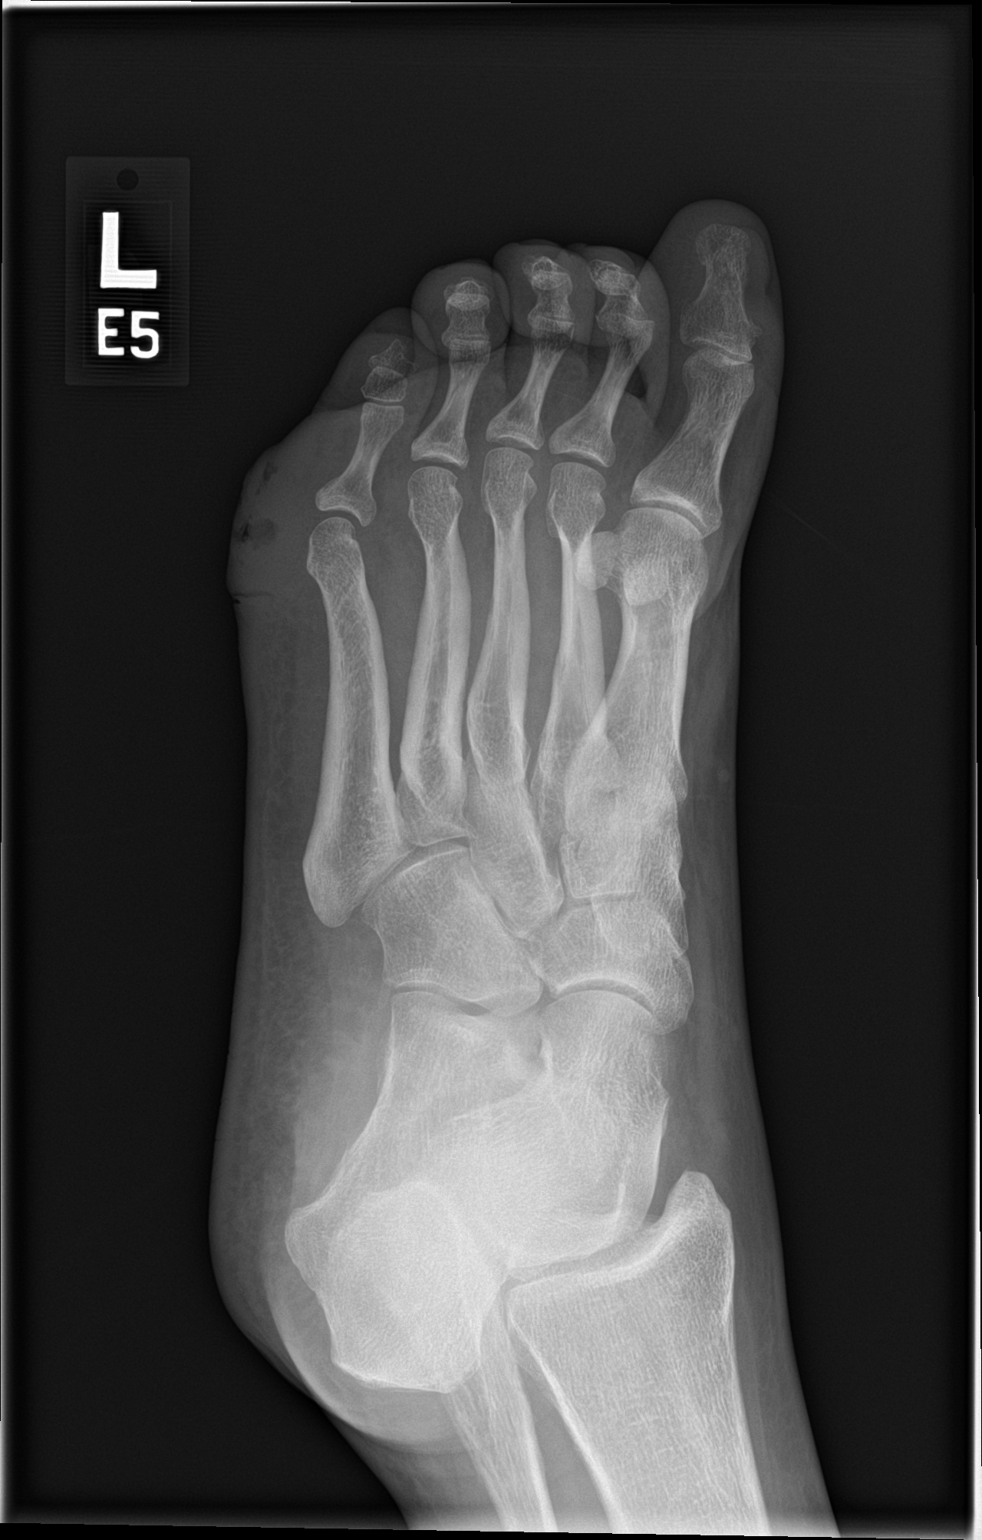

[foot lat]
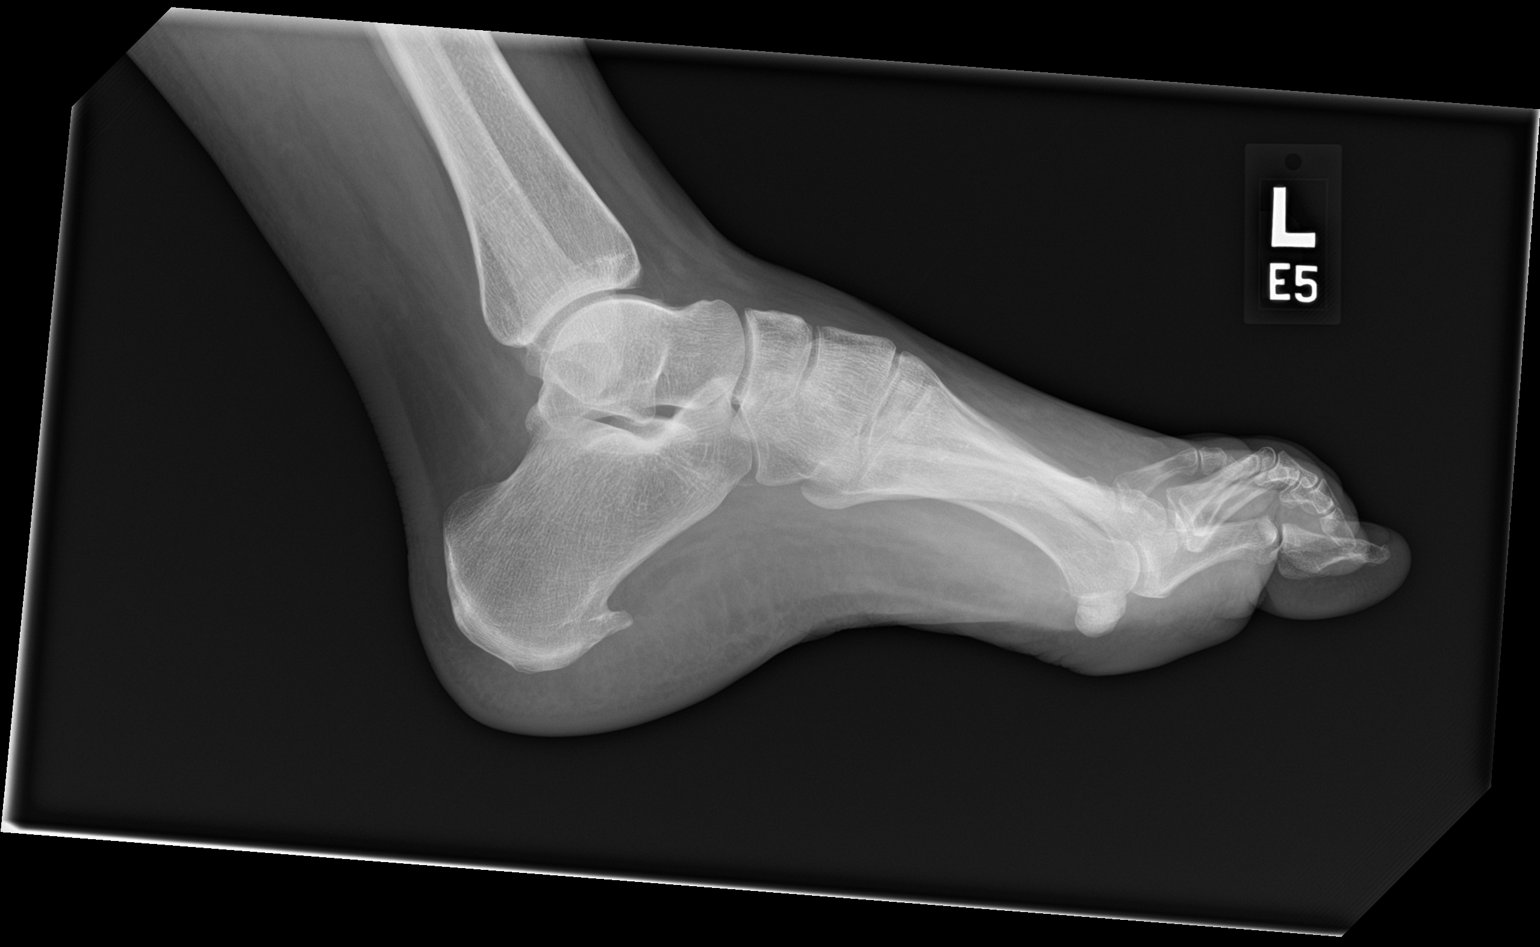

[3 of 3 positions shown; findings below may reference images not displayed]

FINDINGS: Soft tissue swelling and soft tissue gas lateral to the fifth MTP
joint with soft tissue swelling extending to dorsum of foot.

Osseous mineralization normal.

Joint spaces preserved.

No definite fracture, dislocation, or bone destruction.

Small to moderate-sized plantar calcaneal spur.
IMPRESSION: Soft tissue swelling with gas lateral to LEFT fifth MTP joint.

No acute osseous abnormalities.

Plantar calcaneal spur.

## 2023-03-30 ENCOUNTER — Other Ambulatory Visit: Payer: Self-pay

## 2023-03-30 ENCOUNTER — Emergency Department: Payer: Medicaid Other

## 2023-03-30 ENCOUNTER — Inpatient Hospital Stay
Admission: EM | Admit: 2023-03-30 | Discharge: 2023-04-01 | DRG: 074 | Disposition: A | Discharge status: Home / Self Care | Payer: Medicaid Other | Attending: Internal Medicine | Admitting: Internal Medicine

## 2023-03-30 ENCOUNTER — Inpatient Hospital Stay (HOSPITAL_COMMUNITY)
Admit: 2023-03-30 | Discharge: 2023-03-30 | Disposition: A | Discharge status: Home / Self Care | Payer: Medicaid Other | Attending: Internal Medicine | Admitting: Internal Medicine

## 2023-03-30 DIAGNOSIS — E1143 Type 2 diabetes mellitus with diabetic autonomic (poly)neuropathy: Secondary | ICD-10-CM | POA: Diagnosis present

## 2023-03-30 DIAGNOSIS — N179 Acute kidney failure, unspecified: Secondary | ICD-10-CM | POA: Diagnosis present

## 2023-03-30 DIAGNOSIS — E11649 Type 2 diabetes mellitus with hypoglycemia without coma: Secondary | ICD-10-CM | POA: Diagnosis present

## 2023-03-30 DIAGNOSIS — Z7982 Long term (current) use of aspirin: Secondary | ICD-10-CM | POA: Diagnosis not present

## 2023-03-30 DIAGNOSIS — I259 Chronic ischemic heart disease, unspecified: Secondary | ICD-10-CM | POA: Diagnosis not present

## 2023-03-30 DIAGNOSIS — E88819 Insulin resistance, unspecified: Secondary | ICD-10-CM | POA: Diagnosis present

## 2023-03-30 DIAGNOSIS — R7989 Other specified abnormal findings of blood chemistry: Secondary | ICD-10-CM | POA: Diagnosis present

## 2023-03-30 DIAGNOSIS — Z8249 Family history of ischemic heart disease and other diseases of the circulatory system: Secondary | ICD-10-CM | POA: Diagnosis not present

## 2023-03-30 DIAGNOSIS — Z79899 Other long term (current) drug therapy: Secondary | ICD-10-CM | POA: Diagnosis not present

## 2023-03-30 DIAGNOSIS — R9431 Abnormal electrocardiogram [ECG] [EKG]: Secondary | ICD-10-CM | POA: Diagnosis not present

## 2023-03-30 DIAGNOSIS — E785 Hyperlipidemia, unspecified: Secondary | ICD-10-CM | POA: Diagnosis present

## 2023-03-30 DIAGNOSIS — E860 Dehydration: Secondary | ICD-10-CM | POA: Diagnosis present

## 2023-03-30 DIAGNOSIS — R197 Diarrhea, unspecified: Secondary | ICD-10-CM | POA: Diagnosis present

## 2023-03-30 DIAGNOSIS — Z833 Family history of diabetes mellitus: Secondary | ICD-10-CM

## 2023-03-30 DIAGNOSIS — I2510 Atherosclerotic heart disease of native coronary artery without angina pectoris: Secondary | ICD-10-CM | POA: Diagnosis present

## 2023-03-30 DIAGNOSIS — Z23 Encounter for immunization: Secondary | ICD-10-CM

## 2023-03-30 DIAGNOSIS — R55 Syncope and collapse: Secondary | ICD-10-CM

## 2023-03-30 DIAGNOSIS — Z9851 Tubal ligation status: Secondary | ICD-10-CM

## 2023-03-30 DIAGNOSIS — Z6829 Body mass index (BMI) 29.0-29.9, adult: Secondary | ICD-10-CM | POA: Diagnosis not present

## 2023-03-30 DIAGNOSIS — Z794 Long term (current) use of insulin: Secondary | ICD-10-CM | POA: Diagnosis not present

## 2023-03-30 DIAGNOSIS — K3184 Gastroparesis: Secondary | ICD-10-CM | POA: Diagnosis present

## 2023-03-30 DIAGNOSIS — I2489 Other forms of acute ischemic heart disease: Secondary | ICD-10-CM | POA: Diagnosis present

## 2023-03-30 DIAGNOSIS — J449 Chronic obstructive pulmonary disease, unspecified: Secondary | ICD-10-CM | POA: Diagnosis present

## 2023-03-30 DIAGNOSIS — E663 Overweight: Secondary | ICD-10-CM | POA: Insufficient documentation

## 2023-03-30 DIAGNOSIS — Z87891 Personal history of nicotine dependence: Secondary | ICD-10-CM

## 2023-03-30 DIAGNOSIS — R112 Nausea with vomiting, unspecified: Principal | ICD-10-CM

## 2023-03-30 LAB — CBC WITH DIFFERENTIAL/PLATELET
Abs Immature Granulocytes: 0.14 10*3/uL — ABNORMAL HIGH (ref 0.00–0.07)
Basophils Absolute: 0.1 10*3/uL (ref 0.0–0.1)
Basophils Relative: 0 %
Eosinophils Absolute: 0 10*3/uL (ref 0.0–0.5)
Eosinophils Relative: 0 %
HCT: 46.7 % — ABNORMAL HIGH (ref 36.0–46.0)
Hemoglobin: 15.7 g/dL — ABNORMAL HIGH (ref 12.0–15.0)
Immature Granulocytes: 1 %
Lymphocytes Relative: 15 %
Lymphs Abs: 2.3 10*3/uL (ref 0.7–4.0)
MCH: 27.7 pg (ref 26.0–34.0)
MCHC: 33.6 g/dL (ref 30.0–36.0)
MCV: 82.4 fL (ref 80.0–100.0)
Monocytes Absolute: 0.2 10*3/uL (ref 0.1–1.0)
Monocytes Relative: 2 %
Neutro Abs: 12.8 10*3/uL — ABNORMAL HIGH (ref 1.7–7.7)
Neutrophils Relative %: 82 %
Platelets: 462 10*3/uL — ABNORMAL HIGH (ref 150–400)
RBC: 5.67 MIL/uL — ABNORMAL HIGH (ref 3.87–5.11)
RDW: 13.3 % (ref 11.5–15.5)
WBC: 15.5 10*3/uL — ABNORMAL HIGH (ref 4.0–10.5)
nRBC: 0 % (ref 0.0–0.2)

## 2023-03-30 LAB — COMPREHENSIVE METABOLIC PANEL
ALT: 31 U/L (ref 0–44)
AST: 35 U/L (ref 15–41)
Albumin: 5 g/dL (ref 3.5–5.0)
Alkaline Phosphatase: 173 U/L — ABNORMAL HIGH (ref 38–126)
Anion gap: 18 — ABNORMAL HIGH (ref 5–15)
BUN: 33 mg/dL — ABNORMAL HIGH (ref 6–20)
CO2: 22 mmol/L (ref 22–32)
Calcium: 10 mg/dL (ref 8.9–10.3)
Chloride: 95 mmol/L — ABNORMAL LOW (ref 98–111)
Creatinine, Ser: 2.13 mg/dL — ABNORMAL HIGH (ref 0.44–1.00)
GFR, Estimated: 28 mL/min — ABNORMAL LOW (ref >60)
Glucose, Bld: 307 mg/dL — ABNORMAL HIGH (ref 70–99)
Potassium: 4.1 mmol/L (ref 3.5–5.1)
Sodium: 135 mmol/L (ref 135–145)
Total Bilirubin: 1 mg/dL (ref 0.3–1.2)
Total Protein: 10.2 g/dL — ABNORMAL HIGH (ref 6.5–8.1)

## 2023-03-30 LAB — ECHOCARDIOGRAM COMPLETE
Height: 69 in
S' Lateral: 2.3 cm
Weight: 3161.6 [oz_av]

## 2023-03-30 LAB — TROPONIN I (HIGH SENSITIVITY)
Troponin I (High Sensitivity): 112 ng/L (ref <18)
Troponin I (High Sensitivity): 84 ng/L — ABNORMAL HIGH (ref <18)

## 2023-03-30 LAB — CBG MONITORING, ED
Glucose-Capillary: 191 mg/dL — ABNORMAL HIGH (ref 70–99)
Glucose-Capillary: 206 mg/dL — ABNORMAL HIGH (ref 70–99)

## 2023-03-30 LAB — LIPASE, BLOOD: Lipase: 54 U/L — ABNORMAL HIGH (ref 11–51)

## 2023-03-30 LAB — GLUCOSE, CAPILLARY: Glucose-Capillary: 190 mg/dL — ABNORMAL HIGH (ref 70–99)

## 2023-03-30 LAB — HCG, QUANTITATIVE, PREGNANCY: hCG, Beta Chain, Quant, S: 1 m[IU]/mL (ref <5)

## 2023-03-30 MED ORDER — HYDROMORPHONE HCL 1 MG/ML IJ SOLN: 0.5000 mg | INTRAMUSCULAR | Status: DC | PRN

## 2023-03-30 MED ORDER — ATORVASTATIN CALCIUM 20 MG PO TABS
40.0000 mg | ORAL_TABLET | Freq: Every day | ORAL | Status: DC
  Administered 2023-03-30 – 2023-04-01 (×3): 40 mg via ORAL
  Filled 2023-03-30 (×3): qty 2

## 2023-03-30 MED ORDER — SODIUM CHLORIDE 0.9 % IV SOLN: INTRAVENOUS | Status: AC

## 2023-03-30 MED ORDER — INSULIN GLARGINE-YFGN 100 UNIT/ML ~~LOC~~ SOLN
30.0000 [IU] | Freq: Every day | SUBCUTANEOUS | Status: DC
  Administered 2023-03-30: 30 [IU] via SUBCUTANEOUS
  Filled 2023-03-30 (×2): qty 0.3

## 2023-03-30 MED ORDER — TRAZODONE HCL 50 MG PO TABS: 50.0000 mg | ORAL_TABLET | Freq: Every evening | ORAL | Status: DC | PRN

## 2023-03-30 MED ORDER — ENOXAPARIN SODIUM 40 MG/0.4ML IJ SOSY
40.0000 mg | PREFILLED_SYRINGE | INTRAMUSCULAR | Status: DC
  Administered 2023-03-30 – 2023-03-31 (×2): 40 mg via SUBCUTANEOUS
  Filled 2023-03-30 (×2): qty 0.4

## 2023-03-30 MED ORDER — ACETAMINOPHEN 650 MG RE SUPP: 650.0000 mg | Freq: Four times a day (QID) | RECTAL | Status: DC | PRN

## 2023-03-30 MED ORDER — ASPIRIN 81 MG PO CHEW
81.0000 mg | CHEWABLE_TABLET | Freq: Every day | ORAL | Status: DC
  Administered 2023-03-31 – 2023-04-01 (×2): 81 mg via ORAL
  Filled 2023-03-30 (×3): qty 1

## 2023-03-30 MED ORDER — PANTOPRAZOLE SODIUM 40 MG IV SOLR
40.0000 mg | INTRAVENOUS | Status: DC
  Administered 2023-03-30 – 2023-04-01 (×3): 40 mg via INTRAVENOUS
  Filled 2023-03-30 (×3): qty 10

## 2023-03-30 MED ORDER — METOCLOPRAMIDE HCL 5 MG/ML IJ SOLN
10.0000 mg | Freq: Four times a day (QID) | INTRAMUSCULAR | Status: DC
  Administered 2023-03-30 – 2023-04-01 (×8): 10 mg via INTRAVENOUS
  Filled 2023-03-30 (×8): qty 2

## 2023-03-30 MED ORDER — SODIUM CHLORIDE 0.9 % IV BOLUS (SEPSIS)
1000.0000 mL | Freq: Once | INTRAVENOUS | Status: AC
  Administered 2023-03-30: 1000 mL via INTRAVENOUS

## 2023-03-30 MED ORDER — ACETAMINOPHEN 325 MG PO TABS: 650.0000 mg | ORAL_TABLET | Freq: Four times a day (QID) | ORAL | Status: DC | PRN

## 2023-03-30 MED ORDER — KETOROLAC TROMETHAMINE 30 MG/ML IJ SOLN
30.0000 mg | Freq: Once | INTRAMUSCULAR | Status: AC
  Administered 2023-03-30: 30 mg via INTRAVENOUS
  Filled 2023-03-30: qty 1

## 2023-03-30 MED ORDER — ASPIRIN 81 MG PO CHEW
324.0000 mg | CHEWABLE_TABLET | Freq: Once | ORAL | Status: AC
  Administered 2023-03-30: 324 mg via ORAL
  Filled 2023-03-30: qty 4

## 2023-03-30 MED ORDER — FLUOXETINE HCL 20 MG PO CAPS
40.0000 mg | ORAL_CAPSULE | Freq: Every day | ORAL | Status: DC
  Administered 2023-03-30 – 2023-04-01 (×3): 40 mg via ORAL
  Filled 2023-03-30 (×3): qty 2

## 2023-03-30 MED ORDER — ONDANSETRON HCL 4 MG/2ML IJ SOLN: 4.0000 mg | Freq: Four times a day (QID) | INTRAMUSCULAR | Status: DC | PRN

## 2023-03-30 MED ORDER — INFLUENZA VIRUS VACC SPLIT PF (FLUZONE) 0.5 ML IM SUSY
0.5000 mL | PREFILLED_SYRINGE | INTRAMUSCULAR | Status: AC
  Administered 2023-03-31: 0.5 mL via INTRAMUSCULAR
  Filled 2023-03-30: qty 0.5

## 2023-03-30 MED ORDER — ONDANSETRON HCL 4 MG PO TABS: 4.0000 mg | ORAL_TABLET | Freq: Four times a day (QID) | ORAL | Status: DC | PRN

## 2023-03-30 MED ORDER — INSULIN ASPART 100 UNIT/ML IJ SOLN: 0.0000 [IU] | Freq: Every day | INTRAMUSCULAR | Status: DC

## 2023-03-30 MED ORDER — INSULIN ASPART 100 UNIT/ML IJ SOLN
0.0000 [IU] | Freq: Three times a day (TID) | INTRAMUSCULAR | Status: DC
  Administered 2023-03-30: 7 [IU] via SUBCUTANEOUS
  Administered 2023-03-30: 4 [IU] via SUBCUTANEOUS
  Administered 2023-03-31: 7 [IU] via SUBCUTANEOUS
  Filled 2023-03-30 (×3): qty 1

## 2023-03-30 MED ORDER — ONDANSETRON HCL 4 MG/2ML IJ SOLN
4.0000 mg | Freq: Once | INTRAMUSCULAR | Status: AC
  Administered 2023-03-30: 4 mg via INTRAVENOUS
  Filled 2023-03-30: qty 2

## 2023-03-30 MED ORDER — SUCRALFATE 1 G PO TABS
1.0000 g | ORAL_TABLET | Freq: Four times a day (QID) | ORAL | Status: DC
  Administered 2023-03-30 – 2023-04-01 (×9): 1 g via ORAL
  Filled 2023-03-30 (×9): qty 1

## 2023-03-30 NOTE — ED Provider Notes (Signed)
[facility] Provider Note    Event Date/Time   First MD Initiated Contact with Patient 03/30/23 [phone]     (approximate)   History   Emesis and Fall   HPI  Brenda Mccarthy is a 47 y.o. female with history of diabetes, gastroparesis, migraines who presents to the emergency department with nausea, vomiting and diarrhea for the past 2 days.  States she felt lightheaded today and had a syncopal event when coming into the emergency department.  No chest pain or shortness of breath.  Has had dysuria.  No fever.  No vaginal bleeding or discharge.  Has had previous tubal ligation.  States she did bump her head when she passed out but is not having headache, neck or back pain.  Not on blood thinners.  Complaining of generalized abdominal pain.   History provided by patient, significant other.    Past Medical History:  Diagnosis Date   Diabetes mellitus without complication (HCC)    Diabetic neuropathy (HCC)    Gastroparesis    Hydradenitis    Migraine     Past Surgical History:  Procedure Laterality Date   ANTERIOR CRUCIATE LIGAMENT REPAIR     TUBAL LIGATION      MEDICATIONS:  Prior to Admission medications   Medication Sig Start Date End Date Taking? Authorizing Provider  aspirin 81 MG chewable tablet Chew 81 mg by mouth daily.    [provider]  atorvastatin (LIPITOR) 40 MG tablet Take 40 mg by mouth daily.    [provider]  cetirizine (ZYRTEC) 10 MG tablet Take 10 mg by mouth daily.    [provider]  diphenhydrAMINE (BENADRYL) 25 mg capsule Take 25 mg by mouth every 6 (six) hours as needed for allergies.    [provider]  FLUoxetine (PROZAC) 40 MG capsule Take 40 mg by mouth daily.    [provider]  insulin aspart (NOVOLOG) 100 UNIT/ML injection Inject 8 Units into the skin 2 (two) times daily.    [provider]  insulin glargine (LANTUS) 100 UNIT/ML injection Inject 0.58 mLs (58  Units total) into the skin at bedtime. 12/15/22   Enedina Finner, MD  liraglutide (VICTOZA) 18 MG/3ML SOPN Inject 1.8 mg into the skin daily.    [provider]  methocarbamol (ROBAXIN) 750 MG tablet Take 750 mg by mouth 2 (two) times daily.    [provider]  metoCLOPramide (REGLAN) 10 MG tablet Take 1 tablet (10 mg total) by mouth every 6 (six) hours as needed for nausea. Patient taking differently: Take 10 mg by mouth in the morning, at noon, in the evening, and at bedtime. 12/05/20   Chesley Noon, MD  pantoprazole (PROTONIX) 40 MG tablet Take 40 mg by mouth 2 (two) times daily.    [provider]  sucralfate (CARAFATE) 1 g tablet Take 1 g by mouth 4 (four) times daily.    [provider]  traZODone (DESYREL) 50 MG tablet Take 50 mg by mouth at bedtime as needed for sleep.    [provider]    Physical Exam   Triage Vital Signs: ED Triage Vitals [03/30/23 0505]  Encounter Vitals Group     BP      Systolic BP Percentile      Diastolic BP Percentile      Pulse      Resp      Temp (!) 97.5 F (36.4 C)     Temp Source Oral  SpO2      Weight 197 lb 9.6 oz (89.6 kg)     Height 5\' 9"  (1.753 m)     Head Circumference      Peak Flow      Pain Score 10     Pain Loc      Pain Education      Exclude from Growth Chart     Most recent vital signs: Vitals:   03/30/23 0515 03/30/23 0542  BP: 128/80   Pulse: (!) 102 (!) 102  Resp: 19 18  Temp: (!) 97.5 F (36.4 C)   SpO2: 100% 100%    CONSTITUTIONAL: Alert, responds appropriately to questions. Well-appearing; well-nourished HEAD: Normocephalic, atraumatic EYES: Conjunctivae clear, pupils appear equal, sclera nonicteric ENT: normal nose; moist mucous membranes NECK: Supple, normal ROM CARD: Regular and tachycardic; S1 and S2 appreciated RESP: Normal chest excursion without splinting or tachypnea; breath sounds clear and equal bilaterally; no wheezes, no rhonchi, no rales, no hypoxia  or respiratory distress, speaking full sentences ABD/GI: Non-distended; soft, generalized tenderness without guarding or rebound BACK: The back appears normal EXT: Normal ROM in all joints; no deformity noted, no edema SKIN: Normal color for age and race; warm; no rash on exposed skin NEURO: Moves all extremities equally, normal speech, no facial asymmetry, normal sensation PSYCH: The patient's mood and manner are appropriate.   ED Results / Procedures / Treatments   LABS: (all labs ordered are listed, but only abnormal results are displayed) Labs Reviewed  CBC WITH DIFFERENTIAL/PLATELET - Abnormal; Notable for the following components:      Result Value   WBC 15.5 (*)    RBC 5.67 (*)    Hemoglobin 15.7 (*)    HCT 46.7 (*)    Platelets 462 (*)    Neutro Abs 12.8 (*)    Abs Immature Granulocytes 0.14 (*)    All other components within normal limits  COMPREHENSIVE METABOLIC PANEL - Abnormal; Notable for the following components:   Chloride 95 (*)    Glucose, Bld 307 (*)    BUN 33 (*)    Creatinine, Ser 2.13 (*)    Total Protein 10.2 (*)    Alkaline Phosphatase 173 (*)    GFR, Estimated 28 (*)    Anion gap 18 (*)    All other components within normal limits  LIPASE, BLOOD - Abnormal; Notable for the following components:   Lipase 54 (*)    All other components within normal limits  TROPONIN I (HIGH SENSITIVITY) - Abnormal; Notable for the following components:   Troponin I (High Sensitivity) 112 (*)    All other components within normal limits  HCG, QUANTITATIVE, PREGNANCY  URINALYSIS, ROUTINE W REFLEX MICROSCOPIC  TROPONIN I (HIGH SENSITIVITY)     EKG:  EKG Interpretation Date/Time:  Tuesday March 30 2023 05:10:16 EDT Ventricular Rate:  112 PR Interval:  118 QRS Duration:  100 QT Interval:  372 QTC Calculation: 508 R Axis:   -16  Text Interpretation: Sinus tachycardia Borderline left axis deviation Low voltage, precordial leads Consider anterior infarct Minimal  ST elevation, lateral leads Confirmed by Rochele Raring [phone]) on 03/30/2023 5:14:32 AM         RADIOLOGY: My personal review and interpretation of imaging: CT head shows no acute abnormality.  CT abdomen pelvis unremarkable.  I have personally reviewed all radiology reports.   CT ABDOMEN PELVIS WO CONTRAST  Result Date: 03/30/2023 CLINICAL DATA:  47 year old female with nausea vomiting for 2 days. Abdominal pain. Fall  striking left side. EXAM: CT ABDOMEN AND PELVIS WITHOUT CONTRAST TECHNIQUE: Multidetector CT imaging of the abdomen and pelvis was performed following the standard protocol without IV contrast. RADIATION DOSE REDUCTION: This exam was performed according to the departmental dose-optimization program which includes automated exposure control, adjustment of the mA and/or kV according to patient size and/or use of iterative reconstruction technique. COMPARISON:  CT Abdomen and Pelvis 12/14/2022 and earlier. FINDINGS: Lower chest: Normal heart size and negative lung bases. No pericardial or pleural effusion. Hepatobiliary: Negative noncontrast liver and gallbladder. Pancreas: Negative. Spleen: Negative. Adrenals/Urinary Tract: Negative noncontrast appearance. Incidental pelvic phleboliths. Stomach/Bowel: Decompressed large and small bowel loops throughout the abdomen and pelvis. Cecum partially located in the midline. Normal midline appendix series 2, image 61. Stomach is also mostly decompressed. Decompressed duodenum. No free air, free fluid, or mesenteric inflammation identified. Vascular/Lymphatic: Mild Calcified aortic atherosclerosis. Normal caliber abdominal aorta. No lymphadenopathy identified. Reproductive: Stable, negative. Other: No pelvis free fluid. Musculoskeletal: Chronic lumbosacral junction disc and endplate degeneration. Mild chronic lower thoracic spine endplate degeneration and trace vacuum disc. Chronic lower sacral fracture, S5 segment, new since 2022. No acute osseous  abnormality identified. IMPRESSION: 1. No acute or inflammatory process identified in the noncontrast abdomen or pelvis. 2. Chronic lower sacral fracture, S5 segment. 3.  Aortic Atherosclerosis (ICD10-I70.0). Electronically Signed   By: Odessa Fleming M.D.   On: 03/30/2023 06:53   CT HEAD WO CONTRAST ( )  Result Date: 03/30/2023 CLINICAL DATA:  47 year old female with nausea vomiting for 2 days. Subsequent fall striking left side of head. EXAM: CT HEAD WITHOUT CONTRAST TECHNIQUE: Contiguous axial images were obtained from the base of the skull through the vertex without intravenous contrast. RADIATION DOSE REDUCTION: This exam was performed according to the departmental dose-optimization program which includes automated exposure control, adjustment of the mA and/or kV according to patient size and/or use of iterative reconstruction technique. COMPARISON:  Orbit CT 11/10/2020. FINDINGS: Brain: Cerebral volume is within normal limits. No midline shift, ventriculomegaly, mass effect, evidence of mass lesion, intracranial hemorrhage or evidence of cortically based acute infarction. Gray-white matter differentiation is within normal limits throughout the brain. Normal basilar cisterns. Vascular: Calcified ICA siphon atherosclerosis at the skull base. No suspicious intracranial vascular hyperdensity. Skull: No fracture identified. Sinuses/Orbits: Visualized paranasal sinuses and mastoids are stable and well aerated. Other: Patchy left suboccipital soft tissue stranding which could be acute scalp soft tissue injury there. No other acute orbit or scalp soft tissue injury identified. Small area of medial right forehead (series 3, image 31) scarring suspected. IMPRESSION: 1. Possible left suboccipital scalp soft tissue injury, versus scarring. No skull fracture identified. 2. Normal for age noncontrast CT appearance of the Brain. Some ICA siphon calcified atherosclerosis. Electronically Signed   By: Odessa Fleming M.D.   On:  03/30/2023 06:48     PROCEDURES:  Critical Care performed: Yes, see critical care procedure note(s)   CRITICAL CARE Performed by: Baxter Hire Emara Lichter   Total critical care time: 40 minutes  Critical care time was exclusive of separately billable procedures and treating other patients.  Critical care was necessary to treat or prevent imminent or life-threatening deterioration.  Critical care was time spent personally by me on the following activities: development of treatment plan with patient and/or surrogate as well as nursing, discussions with consultants, evaluation of patient's response to treatment, examination of patient, obtaining history from patient or surrogate, ordering and performing treatments and interventions, ordering and review of laboratory studies, ordering and review of radiographic  studies, pulse oximetry and re-evaluation of patient's condition.   Marland Kitchen1-3 Lead EKG Interpretation  Performed by: Dave Mannes, Layla Maw, DO Authorized by: Zahlia Deshazer, Layla Maw, DO     Interpretation: abnormal     ECG rate:  111   ECG rate assessment: tachycardic     Rhythm: sinus tachycardia     Ectopy: none     Conduction: normal       IMPRESSION / MDM / ASSESSMENT AND PLAN / ED COURSE  I reviewed the triage vital signs and the nursing notes.    Patient here with vomiting, diarrhea, abdominal pain and a syncopal event.  The patient is on the cardiac monitor to evaluate for evidence of arrhythmia and/or significant heart rate changes.   DIFFERENTIAL DIAGNOSIS (includes but not limited to):   Viral gastroenteritis, colitis, appendicitis, bowel obstruction, UTI, gastroparesis, dehydration.  Low suspicion that syncope was from ACS, arrhythmia, PE given no chest pain or shortness of breath.   Patient's presentation is most consistent with acute presentation with potential threat to life or bodily function.   PLAN: Will obtain abdominal labs, urine, CT of the abdomen pelvis.  Will give IV  fluids, Toradol and Zofran for symptomatic relief.  No indication that patient needs head and neck imaging at this time.  She is not having any headache, neurologic deficits and is not on blood thinners.  Not intoxicated.   MEDICATIONS GIVEN IN ED: Medications  sodium chloride 0.9 % bolus 1,000 mL (1,000 mLs Intravenous New Bag/Given 03/30/23 0651)  aspirin chewable tablet 324 mg (has no administration in time range)  sodium chloride 0.9 % bolus 1,000 mL (1,000 mLs Intravenous New Bag/Given 03/30/23 0537)  ondansetron (ZOFRAN) injection 4 mg (4 mg Intravenous Given 03/30/23 0535)  ketorolac (TORADOL) 30 MG/ML injection 30 mg (30 mg Intravenous Given 03/30/23 0535)     ED COURSE: Patient has leukocytosis of 15,000 with left shift.  Creatinine is elevated at 2.1.  Will give second liter of fluid.  LFTs unremarkable other than elevated alkaline phosphatase which is chronic for her.  Lipase minimally elevated at 54.  Troponin is elevated at 112 which could be from dehydration, decreased clearance from AKI, demand ischemia as she denies any chest pain or shortness of breath.  Given she may need heparin, will obtain head CT given her head injury.   CTs reviewed and interpreted by myself and the radiologist and showed no acute abnormality.  Will admit for dehydration, AKI, syncope with elevated troponin.  Will discuss with hospitalist.  CONSULTS:  Consulted and discussed patient's case with hospitalist.  I have recommended admission and consulting physician agrees and will place admission orders.  Patient (and family if present) agree with this plan.   I reviewed all nursing notes, vitals, pertinent previous records.  All labs, EKGs, imaging ordered have been independently reviewed and interpreted by myself.    OUTSIDE RECORDS REVIEWED: Reviewed last admission in June 2024 for diabetic gastroparesis.       FINAL CLINICAL IMPRESSION(S) / ED DIAGNOSES   Final diagnoses:  Nausea vomiting and  diarrhea  Syncope, unspecified syncope type  AKI (acute kidney injury) (HCC)  Elevated troponin     Rx / DC Orders   ED Discharge Orders     None        Note:  This document was prepared using Dragon voice recognition software and may include unintentional dictation errors.   Bertrice Leder, Layla Maw, DO 03/30/23 [phone]

## 2023-03-30 NOTE — H&P (Signed)
History and Physical    Brenda Mccarthy RUE:[id number] DOB: Aug 12, 1975 DOA: 03/30/2023  PCP: Cleon Dew, FNP (Confirm with patient/family/NH records and if not entered, this has to be entered at [facility] point of entry) Patient coming from: Home  I have personally briefly reviewed patient's old medical records in [facility]  Chief Complaint: Nauseous vomiting diarrhea feeling weak and syncope  HPI: Brenda Mccarthy is a 47 y.o. female with medical history significant of IDDM with insulin resistance, diabetic neuropathy, diabetic gastroparesis, presented with worsening of GI symptoms including nauseous vomiting diarrhea, abdominal pain and malaise generalized weakness and syncope.  Patient has had frequent flareup of gastroparesis since May this year for which she has been following with [facility].  Recently about 2 weeks ago she was started on pyridostigmine " to move stomach" by her GI doctor.  She took the medication for about 1 week and then started to have severe nauseous vomiting and diarrhea, associated with frequent abdominal cramping.  She denies any fever or chills, no chest pain or shortness of breath.  She contacted her GI doctor over the weekend and was told to see dietitian.  However over the weekend she could not tolerate any food or liquid and became very dehydrated and last night, patient had 1 episode of syncope where she started feel lightheadedness and blurry vision and then passed out for few seconds and collapsed and hit her head.  She estimated LOC few seconds, no postictal confusions.  She woke up herself. ED Course: Tachycardia blood pressure 120/80 and tachycardia responded to IV fluid bolus x 2 in the ED.  CT head and CT abdominal pelvis showed no acute findings.  Blood work showed AKI with creatinine 2.1 compared to baseline less than 1.0, hemoconcentration of WBC 15 and hemoglobin 15.7.  Troponin 112>84.  EKG showed no acute ST changes.  Review of  Systems: As per HPI otherwise 14 point review of systems negative.    Past Medical History:  Diagnosis Date   Diabetes mellitus without complication (HCC)    Diabetic neuropathy (HCC)    Gastroparesis    Hydradenitis    Migraine     Past Surgical History:  Procedure Laterality Date   ANTERIOR CRUCIATE LIGAMENT REPAIR     TUBAL LIGATION       reports that she has quit smoking. She has never used smokeless tobacco. She reports that she does not currently use alcohol. She reports current drug use. Drug: Marijuana.  Allergies  Allergen Reactions   Bee Venom Anaphylaxis   Metformin Diarrhea and Nausea And Vomiting   Other     Throat swelling with bleach    Family History  Problem Relation Age of Onset   Bipolar disorder Mother    Diabetes Mother    Heart disease Mother     Prior to Admission medications   Medication Sig Start Date End Date Taking? Authorizing Provider  aspirin 81 MG chewable tablet Chew 81 mg by mouth daily.    [provider]  atorvastatin (LIPITOR) 40 MG tablet Take 40 mg by mouth daily.    [provider]  cetirizine (ZYRTEC) 10 MG tablet Take 10 mg by mouth daily.    [provider]  diphenhydrAMINE (BENADRYL) 25 mg capsule Take 25 mg by mouth every 6 (six) hours as needed for allergies.    [provider]  FLUoxetine (PROZAC) 40 MG capsule Take 40 mg by mouth daily.    [provider]  insulin  aspart (NOVOLOG) 100 UNIT/ML injection Inject 8 Units into the skin 2 (two) times daily.    [provider]  insulin glargine (LANTUS) 100 UNIT/ML injection Inject 0.58 mLs (58 Units total) into the skin at bedtime. 12/15/22   Enedina Finner, MD  liraglutide (VICTOZA) 18 MG/3ML SOPN Inject 1.8 mg into the skin daily.    [provider]  methocarbamol (ROBAXIN) 750 MG tablet Take 750 mg by mouth 2 (two) times daily.    [provider]  metoCLOPramide (REGLAN) 10 MG tablet Take 1 tablet (10 mg total)  by mouth every 6 (six) hours as needed for nausea. Patient taking differently: Take 10 mg by mouth in the morning, at noon, in the evening, and at bedtime. 12/05/20   Chesley Noon, MD  pantoprazole (PROTONIX) 40 MG tablet Take 40 mg by mouth 2 (two) times daily.    [provider]  sucralfate (CARAFATE) 1 g tablet Take 1 g by mouth 4 (four) times daily.    [provider]  traZODone (DESYREL) 50 MG tablet Take 50 mg by mouth at bedtime as needed for sleep.    [provider]    Physical Exam: Vitals:   03/30/23 0506 03/30/23 0515 03/30/23 0542 03/30/23 0730  BP: 128/80 128/80  136/78  Pulse: (!) 115 (!) 102 (!) 102 100  Resp: 19 19 18 14   Temp:  (!) 97.5 F (36.4 C)    TempSrc:  Oral    SpO2: 99% 100% 100% 99%  Weight:      Height:        Constitutional: NAD, calm, comfortable Vitals:   03/30/23 0506 03/30/23 0515 03/30/23 0542 03/30/23 0730  BP: 128/80 128/80  136/78  Pulse: (!) 115 (!) 102 (!) 102 100  Resp: 19 19 18 14   Temp:  (!) 97.5 F (36.4 C)    TempSrc:  Oral    SpO2: 99% 100% 100% 99%  Weight:      Height:       Eyes: PERRL, lids and conjunctivae normal ENMT: Mucous membranes are dry. Posterior pharynx clear of any exudate or lesions.Normal dentition.  Neck: normal, supple, no masses, no thyromegaly Respiratory: clear to auscultation bilaterally, no wheezing, no crackles. Normal respiratory effort. No accessory muscle use.  Cardiovascular: Regular rate and rhythm, no murmurs / rubs / gallops. No extremity edema. 2+ pedal pulses. No carotid bruits.  Abdomen: no tenderness, no masses palpated. No hepatosplenomegaly. Bowel sounds positive.  Musculoskeletal: no clubbing / cyanosis. No joint deformity upper and lower extremities. Good ROM, no contractures. Normal muscle tone.  Skin: no rashes, lesions, ulcers. No induration Neurologic: CN 2-12 grossly intact. Sensation intact, DTR normal. Strength 5/5 in all 4.  Psychiatric: Normal judgment  and insight. Alert and oriented x 3. Normal mood.     Labs on Admission: I have personally reviewed following labs and imaging studies  CBC: Recent Labs  Lab 03/30/23 0510  WBC 15.5*  NEUTROABS 12.8*  HGB 15.7*  HCT 46.7*  MCV 82.4  PLT 462*   Basic Metabolic Panel: Recent Labs  Lab 03/30/23 0510  NA 135  K 4.1  CL 95*  CO2 22  GLUCOSE 307*  BUN 33*  CREATININE 2.13*  CALCIUM 10.0   GFR: Estimated Creatinine Clearance: 38.1 mL/min (A) (by C-G formula based on SCr of 2.13 mg/dL (H)). Liver Function Tests: Recent Labs  Lab 03/30/23 0510  AST 35  ALT 31  ALKPHOS 173*  BILITOT 1.0  PROT 10.2*  ALBUMIN 5.0  Recent Labs  Lab 03/30/23 0510  LIPASE 54*   No results for input(s): "AMMONIA" in the last 168 hours. Coagulation Profile: No results for input(s): "INR", "PROTIME" in the last 168 hours. Cardiac Enzymes: No results for input(s): "CKTOTAL", "CKMB", "CKMBINDEX", "TROPONINI" in the last 168 hours. BNP (last 3 results) No results for input(s): "PROBNP" in the last 8760 hours. HbA1C: No results for input(s): "HGBA1C" in the last 72 hours. CBG: No results for input(s): "GLUCAP" in the last 168 hours. Lipid Profile: No results for input(s): "CHOL", "HDL", "LDLCALC", "TRIG", "CHOLHDL", "LDLDIRECT" in the last 72 hours. Thyroid Function Tests: No results for input(s): "TSH", "T4TOTAL", "FREET4", "T3FREE", "THYROIDAB" in the last 72 hours. Anemia Panel: No results for input(s): "VITAMINB12", "FOLATE", "FERRITIN", "TIBC", "IRON", "RETICCTPCT" in the last 72 hours. Urine analysis:    Component Value Date/Time   COLORURINE YELLOW (A) 12/14/2022 0606   APPEARANCEUR HAZY (A) 12/14/2022 0606   APPEARANCEUR CLOUDY 05/29/2013 1801   LABSPEC 1.014 12/14/2022 0606   LABSPEC >=1.030 05/29/2013 1801   PHURINE 7.0 12/14/2022 0606   GLUCOSEU >=500 (A) 12/14/2022 0606   GLUCOSEU NEGATIVE 05/29/2013 1801   HGBUR LARGE (A) 12/14/2022 0606   BILIRUBINUR NEGATIVE  12/14/2022 0606   BILIRUBINUR NEGATIVE 05/29/2013 1801   KETONESUR 20 (A) 12/14/2022 0606   PROTEINUR 100 (A) 12/14/2022 0606   NITRITE NEGATIVE 12/14/2022 0606   LEUKOCYTESUR NEGATIVE 12/14/2022 0606   LEUKOCYTESUR 2+ 05/29/2013 1801    Radiological Exams on Admission: CT ABDOMEN PELVIS WO CONTRAST  Result Date: 03/30/2023 CLINICAL DATA:  47 year old female with nausea vomiting for 2 days. Abdominal pain. Fall striking left side. EXAM: CT ABDOMEN AND PELVIS WITHOUT CONTRAST TECHNIQUE: Multidetector CT imaging of the abdomen and pelvis was performed following the standard protocol without IV contrast. RADIATION DOSE REDUCTION: This exam was performed according to the departmental dose-optimization program which includes automated exposure control, adjustment of the mA and/or kV according to patient size and/or use of iterative reconstruction technique. COMPARISON:  CT Abdomen and Pelvis 12/14/2022 and earlier. FINDINGS: Lower chest: Normal heart size and negative lung bases. No pericardial or pleural effusion. Hepatobiliary: Negative noncontrast liver and gallbladder. Pancreas: Negative. Spleen: Negative. Adrenals/Urinary Tract: Negative noncontrast appearance. Incidental pelvic phleboliths. Stomach/Bowel: Decompressed large and small bowel loops throughout the abdomen and pelvis. Cecum partially located in the midline. Normal midline appendix series 2, image 61. Stomach is also mostly decompressed. Decompressed duodenum. No free air, free fluid, or mesenteric inflammation identified. Vascular/Lymphatic: Mild Calcified aortic atherosclerosis. Normal caliber abdominal aorta. No lymphadenopathy identified. Reproductive: Stable, negative. Other: No pelvis free fluid. Musculoskeletal: Chronic lumbosacral junction disc and endplate degeneration. Mild chronic lower thoracic spine endplate degeneration and trace vacuum disc. Chronic lower sacral fracture, S5 segment, new since 2022. No acute osseous abnormality  identified. IMPRESSION: 1. No acute or inflammatory process identified in the noncontrast abdomen or pelvis. 2. Chronic lower sacral fracture, S5 segment. 3.  Aortic Atherosclerosis (ICD10-I70.0). Electronically Signed   By: Odessa Fleming M.D.   On: 03/30/2023 06:53   CT HEAD WO CONTRAST ( )  Result Date: 03/30/2023 CLINICAL DATA:  47 year old female with nausea vomiting for 2 days. Subsequent fall striking left side of head. EXAM: CT HEAD WITHOUT CONTRAST TECHNIQUE: Contiguous axial images were obtained from the base of the skull through the vertex without intravenous contrast. RADIATION DOSE REDUCTION: This exam was performed according to the departmental dose-optimization program which includes automated exposure control, adjustment of the mA and/or kV according to patient size and/or use of iterative  reconstruction technique. COMPARISON:  Orbit CT 11/10/2020. FINDINGS: Brain: Cerebral volume is within normal limits. No midline shift, ventriculomegaly, mass effect, evidence of mass lesion, intracranial hemorrhage or evidence of cortically based acute infarction. Gray-white matter differentiation is within normal limits throughout the brain. Normal basilar cisterns. Vascular: Calcified ICA siphon atherosclerosis at the skull base. No suspicious intracranial vascular hyperdensity. Skull: No fracture identified. Sinuses/Orbits: Visualized paranasal sinuses and mastoids are stable and well aerated. Other: Patchy left suboccipital soft tissue stranding which could be acute scalp soft tissue injury there. No other acute orbit or scalp soft tissue injury identified. Small area of medial right forehead (series 3, image 31) scarring suspected. IMPRESSION: 1. Possible left suboccipital scalp soft tissue injury, versus scarring. No skull fracture identified. 2. Normal for age noncontrast CT appearance of the Brain. Some ICA siphon calcified atherosclerosis. Electronically Signed   By: Odessa Fleming M.D.   On: 03/30/2023 06:48     EKG: Independently reviewed.  Sinus rhythm, no acute ST changes, QTc 508  Assessment/Plan Principal Problem:   AKI (acute kidney injury) (HCC) Active Problems:   Gastroparesis  (please populate well all problems here in Problem List. (For example, if patient is on BP meds at home and you resume or decide to hold them, it is a problem that needs to be her. Same for CAD, COPD, HLD and so on)  Syncope -Vasovagal, secondary to volume contraction and severe dehydration from repeated nauseous vomiting and diarrhea. -Clinically still appears to be volume depleted, plan to continue IV fluid x 24 hours.  AKI, prerenal -Secondary to volume contraction, IV fluid as above  Severe gastroparesis flareup -Versus side effect of recently started pyridostigmine -Hold off pyridostigmine -Start Reglan IV every 6 hours -Supportive care IV fluid Zofran as needed and IV Dilaudid for cramping -Consult dietitian  Troponin elevation -Likely demanding ischemia from persistent tachycardia and volume depletion, no chest pains and EKG showed no significant ST-T changes.  The trending of troponins apparently is flat arguing against ACS. -Echocardiogram -Outpatient follow-up with PCP/cardiology for outpatient stress test.  IDDM with insulin resistance -With hyperglycemia, most recent A1c 8.5. -Given the patient has significant decrease of p.o. intake, will cut down her Lantus to 30 units daily, add sliding scale.  Leukocytosis -Likely secondary to hemoconcentration, monitor off antibiotics. -Check UA  Borderline prolonged Qtc -Patient on multiple SSRI and starting Reglan, will repeat EKG tomorrow  DVT prophylaxis: Lovenox Code Status: Full code Family Communication: None at bedside Disposition Plan: Expect less than 2 midnight hospital stay Consults called: None Admission status: Telemetry observation   Emeline General MD [facility] Pager [phone]  03/30/2023, 8:15 AM

## 2023-03-30 NOTE — Progress Notes (Signed)
*  PRELIMINARY RESULTS* Echocardiogram 2D Echocardiogram has been performed.  Cristela Blue 03/30/2023, 10:51 AM

## 2023-03-30 NOTE — ED Triage Notes (Signed)
Pt arrives POV with CC of fall and emesis. N/V since Sunday. Walking into dept and fell and hit L side of head - no LOC, denies blood thinners. Pt A&O at this time.

## 2023-03-30 NOTE — ED Notes (Signed)
Ambulatory to restroom, non skid socks in place

## 2023-03-31 DIAGNOSIS — R9431 Abnormal electrocardiogram [ECG] [EKG]: Secondary | ICD-10-CM

## 2023-03-31 DIAGNOSIS — K3184 Gastroparesis: Secondary | ICD-10-CM

## 2023-03-31 DIAGNOSIS — E663 Overweight: Secondary | ICD-10-CM

## 2023-03-31 DIAGNOSIS — N179 Acute kidney failure, unspecified: Secondary | ICD-10-CM | POA: Diagnosis not present

## 2023-03-31 DIAGNOSIS — R55 Syncope and collapse: Secondary | ICD-10-CM

## 2023-03-31 DIAGNOSIS — E11649 Type 2 diabetes mellitus with hypoglycemia without coma: Secondary | ICD-10-CM

## 2023-03-31 DIAGNOSIS — R197 Diarrhea, unspecified: Secondary | ICD-10-CM

## 2023-03-31 DIAGNOSIS — I259 Chronic ischemic heart disease, unspecified: Secondary | ICD-10-CM

## 2023-03-31 DIAGNOSIS — Z794 Long term (current) use of insulin: Secondary | ICD-10-CM

## 2023-03-31 LAB — BASIC METABOLIC PANEL
Anion gap: 9 (ref 5–15)
BUN: 31 mg/dL — ABNORMAL HIGH (ref 6–20)
CO2: 22 mmol/L (ref 22–32)
Calcium: 8.2 mg/dL — ABNORMAL LOW (ref 8.9–10.3)
Chloride: 107 mmol/L (ref 98–111)
Creatinine, Ser: 1.38 mg/dL — ABNORMAL HIGH (ref 0.44–1.00)
GFR, Estimated: 47 mL/min — ABNORMAL LOW (ref >60)
Glucose, Bld: 74 mg/dL (ref 70–99)
Potassium: 3.9 mmol/L (ref 3.5–5.1)
Sodium: 138 mmol/L (ref 135–145)

## 2023-03-31 LAB — URINALYSIS, ROUTINE W REFLEX MICROSCOPIC
Bilirubin Urine: NEGATIVE
Glucose, UA: NEGATIVE mg/dL
Ketones, ur: NEGATIVE mg/dL
Nitrite: NEGATIVE
Protein, ur: NEGATIVE mg/dL
Specific Gravity, Urine: 1.008 (ref 1.005–1.030)
pH: 6 (ref 5.0–8.0)

## 2023-03-31 LAB — GASTROINTESTINAL PANEL BY PCR, STOOL (REPLACES STOOL CULTURE)

## 2023-03-31 LAB — CBC
HCT: 33.5 % — ABNORMAL LOW (ref 36.0–46.0)
Hemoglobin: 11.1 g/dL — ABNORMAL LOW (ref 12.0–15.0)
MCH: 27.3 pg (ref 26.0–34.0)
MCHC: 33.1 g/dL (ref 30.0–36.0)
MCV: 82.3 fL (ref 80.0–100.0)
Platelets: 307 10*3/uL (ref 150–400)
RBC: 4.07 MIL/uL (ref 3.87–5.11)
RDW: 13.4 % (ref 11.5–15.5)
WBC: 8 10*3/uL (ref 4.0–10.5)
nRBC: 0 % (ref 0.0–0.2)

## 2023-03-31 LAB — GLUCOSE, CAPILLARY
Glucose-Capillary: 69 mg/dL — ABNORMAL LOW (ref 70–99)
Glucose-Capillary: 86 mg/dL (ref 70–99)
Glucose-Capillary: 208 mg/dL — ABNORMAL HIGH (ref 70–99)
Glucose-Capillary: 105 mg/dL — ABNORMAL HIGH (ref 70–99)
Glucose-Capillary: 124 mg/dL — ABNORMAL HIGH (ref 70–99)

## 2023-03-31 LAB — C DIFFICILE QUICK SCREEN W PCR REFLEX
C Diff antigen: NEGATIVE
C Diff interpretation: NOT DETECTED
C Diff toxin: NEGATIVE

## 2023-03-31 LAB — LIPASE, BLOOD: Lipase: 49 U/L (ref 11–51)

## 2023-03-31 MED ORDER — LOPERAMIDE HCL 2 MG PO CAPS: 2.0000 mg | ORAL_CAPSULE | Freq: Three times a day (TID) | ORAL | Status: DC | PRN

## 2023-03-31 MED ORDER — ADULT MULTIVITAMIN W/MINERALS CH
1.0000 | ORAL_TABLET | Freq: Every day | ORAL | Status: DC
  Administered 2023-03-31 – 2023-04-01 (×2): 1 via ORAL
  Filled 2023-03-31 (×2): qty 1

## 2023-03-31 MED ORDER — INSULIN ASPART 100 UNIT/ML IJ SOLN
0.0000 [IU] | Freq: Three times a day (TID) | INTRAMUSCULAR | Status: DC
  Administered 2023-04-01: 2 [IU] via SUBCUTANEOUS
  Filled 2023-03-31: qty 1

## 2023-03-31 MED ORDER — INSULIN ASPART 100 UNIT/ML IJ SOLN
2.0000 [IU] | Freq: Three times a day (TID) | INTRAMUSCULAR | Status: DC
  Administered 2023-03-31 – 2023-04-01 (×2): 2 [IU] via SUBCUTANEOUS
  Filled 2023-03-31 (×2): qty 1

## 2023-03-31 MED ORDER — INSULIN GLARGINE-YFGN 100 UNIT/ML ~~LOC~~ SOLN
12.0000 [IU] | Freq: Every day | SUBCUTANEOUS | Status: DC
  Administered 2023-03-31 – 2023-04-01 (×2): 12 [IU] via SUBCUTANEOUS
  Filled 2023-03-31 (×2): qty 0.12

## 2023-03-31 NOTE — Assessment & Plan Note (Signed)
Likely secondary to dehydration.

## 2023-03-31 NOTE — Progress Notes (Signed)
Initial Nutrition Assessment  DOCUMENTATION CODES:   Not applicable  INTERVENTION:   -MVI with minerals daily -Glucerna Shake po TID, each supplement provides 220 kcal and 10 grams of protein  -Sent secure chat with DM coordinator regarding possible referral to endocrinology as well as discharge medication recommendations (suspect GLP-1RA may be exacerbating symptoms of gastroparesis) -RD provided "Gastroparesis Nutrition Therapy" handouts and reviewed with pt; attached to AVS/ discharge summary   NUTRITION DIAGNOSIS:   Inadequate oral intake related to poor appetite as evidenced by per patient/family report.  GOAL:   Patient will meet greater than or equal to 90% of their needs  MONITOR:   PO intake, Supplement acceptance  REASON FOR ASSESSMENT:   Consult Assessment of nutrition requirement/status  ASSESSMENT:   Pt with medical history significant of IDDM with insulin resistance, diabetic neuropathy, diabetic gastroparesis, presented with worsening of GI symptoms including nauseous vomiting diarrhea, abdominal pain and malaise generalized weakness and syncope.  Pt admitted with gastroparesis.   Spoke with pt at bedside, who reports feeling better since being admitted to the hospital. Pt shares with this RD that she has experienced a general decline in health over the past 6 months, related to complications with gastroparesis. Pt was initially diagnosed about 2 years ago and has had at least 4 hospitalizations since diagnosis. Pt reports that symptoms have worsened over the past 6 months, reports exacerbation of symptoms and is often afraid to eat due to vomiting and diarrhea. Pt shares she has "good days" and "bad days". Per pt, on a good day, she will consumes 3 meals per day and on a bad day she will eat one meal per day. Pt shares she drinks about a gallon of water per day as well as a lemon lime soda or gingerale to help settle her stomach. Pt resorts to "safe foods" such as  a can of vegetable soup, chef boyardee spaghetti and meatballs, [place] sandwich, or chinese food. 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   Pt reports extreme frustration over symptoms and decline. She suspects that her medications may be exacerbating symptoms. See stopped taking her Lantus a few months ago, as symptoms worsened when this was increased by her PCP. Pt shares that she has continued to take victoza (she was also on this in 2015, but did not have any issues at this time), but pt notes that she suspects this may also be related as she read side effects on the prescription pamphlet. Pt shares that she is concerned about what to eat as symptoms often "come out of nowhere"; she will be able to eat a food, but will then vomit it a few days later.   Additionally, pt has been experiencing functional decline. 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 Emotional support provided.   Reviewed wt hx; pt has experienced a 10.2% wt loss over the past 3 months, which is significant for time frame. Pt reports her UBW is around 220# and estimates a 40# wt loss over the past 6 months.   RD discussed gastroparesis with pt. Pt able to articulate what gastroparesis is and what medications have been tried. RD discussed how to manage gastroparesis including low fat foods, low fiber foods, small, frequent meals, medication management, and optimizing blood sugar.  She reports glycemic control has been a struggle for her over the years, due to lack of insurance and overall access to care. Pt recently got her Medicaid reinstated and is working to qualify for disability. Her DM is managed by her PCP, but has never seen an endocrinologist. Discussed drug mechanism of action of GLP-1RAs and how  drug side effects could exacerbate symptoms (nausea, vomiting, diarrhea) as well as early satiety. RD is suspicious of drug side effects and how this may be worsening gastroparesis. Sent secure chat to DM coordinator for potential endocrinology referral as well as to discuss discharge medication recommendations. Pt has an upcoming appointment with [facility] at the end of the month. She expressed appreciation for visit.   Medications reviewed and include reglan and carafate.   Lab Results  Component Value Date   HGBA1C 8.5 (H) 12/15/2022   PTA DM medications are 58 units insulin glargine daily, 1.8 units victoza daily, and 8 units insulin aspart BID.   Labs reviewed: CBGS: 69-208 (inpatient orders for glycemic control are 0-5 units insulin aspart daily at bedtime, 0-9 units insulin aspart TID with meals, 2 units insulin aspart TID with meals, and 12 units insulin glargine-yfgn daily).    NUTRITION - FOCUSED PHYSICAL EXAM:  Flowsheet Row Most Recent Value  Orbital Region No depletion  Upper Arm Region No depletion  Thoracic and Lumbar Region No depletion  Buccal Region No depletion  Temple Region No depletion  Clavicle Bone Region No depletion  Clavicle and Acromion Bone Region No depletion  Scapular Bone Region No depletion  Dorsal Hand No depletion  Patellar Region No depletion  Anterior Thigh Region No depletion  Posterior Calf Region No depletion  Edema (RD Assessment) Mild  Hair Reviewed  Eyes Reviewed  Mouth Reviewed  Skin Reviewed  Nails Reviewed       Diet Order:   Diet Order             Diet Carb Modified Fluid consistency: Thin  Diet effective now                   EDUCATION NEEDS:   Education needs have been addressed  Skin:  Skin Assessment: Reviewed RN Assessment  Last BM:  03/31/23 (type 7)  Height:   Ht Readings from Last 1 Encounters:  03/30/23 5\' 9"  (1.753 m)    Weight:   Wt Readings from Last 1 Encounters:  03/30/23 89.6 kg    Ideal  Body Weight:  56.9 kg  BMI:  Body mass index is 29.18 kg/m.  Estimated Nutritional Needs:   Kcal:  1950-2150  Protein:  100-115 grams  Fluid:  > 1.9 L    Levada Schilling, RD, LDN, CDCES Registered Dietitian III Certified Diabetes Care and Education Specialist Please refer to [facility] for RD and/or RD on-call/weekend/after hours pager

## 2023-03-31 NOTE — Assessment & Plan Note (Signed)
Demand ischemia from vomiting

## 2023-03-31 NOTE — Assessment & Plan Note (Signed)
BMI 29.18

## 2023-03-31 NOTE — Assessment & Plan Note (Signed)
Patient tolerated advanced diet.  Continue her usual medications Reglan Carafate and Protonix.

## 2023-03-31 NOTE — Assessment & Plan Note (Signed)
Careful with nausea medication.

## 2023-03-31 NOTE — Progress Notes (Signed)
Pt is alert and oriented X 4. Blood sugar was 69, followed hypoglycemic protocols and  result came back with 86.

## 2023-03-31 NOTE — Plan of Care (Signed)
  Problem: Education: Goal: Ability to describe self-care measures that may prevent or decrease complications (Diabetes Survival Skills Education) will improve Outcome: Progressing   Problem: Coping: Goal: Ability to adjust to condition or change in health will improve Outcome: Progressing   Problem: Nutritional: Goal: Maintenance of adequate nutrition will improve Outcome: Progressing   Problem: Skin Integrity: Goal: Risk for impaired skin integrity will decrease Outcome: Progressing   Problem: Tissue Perfusion: Goal: Adequacy of tissue perfusion will improve Outcome: Progressing

## 2023-03-31 NOTE — Progress Notes (Signed)
  Progress Note   Patient: Brenda Mccarthy ZOX:[id number] DOB: 1975/12/07 DOA: 03/30/2023     1 DOS: the patient was seen and examined on 03/31/2023   Brief hospital course: 47 year old female past medical history of insulin-dependent diabetes mellitus, diabetic neuropathy diabetic gastroparesis presenting with worsening GI symptoms with nausea vomiting and diarrhea and abdominal pain.  CT scan was negative.  10/9.  Patient feeling better and wanting to advance to solid food for dinner.  Had quite a few episodes of diarrhea and stool studies were negative.  Assessment and Plan: * Gastroparesis Continue Reglan and as needed nausea medication also on Carafate and Protonix.  Patient wanting to advance diet today.  AKI (acute kidney injury) (HCC) Creatinine 2.13 on presentation down to 1.38.  With IV fluid shortage and patient not vomiting will discontinue IV fluid.  Syncope Likely secondary to dehydration.  Myocardial ischemia Demand ischemia from vomiting  QT prolongation Careful with nausea medication.  Overweight (BMI 25.0-29.9) BMI 29.18  Uncontrolled type 2 diabetes mellitus with hypoglycemia, with long-term current use of insulin (HCC) Patient had a low sugar of 69 this morning.  Her long-acting insulin was held Until the afternoon where we started Semglee insulin 12 units daily and sliding scale insulin.  Diarrhea Stool studies negative.  As needed Imodium        Subjective: Patient feeling better and hopefully wants to start solid food this evening.  Admitted with diabetic gastroparesis.  Had a quite a few episodes of diarrhea.  Stool studies negative.  Physical Exam: Vitals:   03/31/23 0757 03/31/23 1216 03/31/23 1219 03/31/23 1221  BP: 130/73 (!) 125/55 125/71 115/70  Pulse: 75 80 81 89  Resp: 17     Temp: 97.9 F (36.6 C)     TempSrc:      SpO2: 98% 99% 100% 100%  Weight:      Height:       Physical Exam HENT:     Head: Normocephalic.      Mouth/Throat:     Pharynx: No oropharyngeal exudate.  Eyes:     General: Lids are normal.     Conjunctiva/sclera: Conjunctivae normal.  Cardiovascular:     Rate and Rhythm: Normal rate and regular rhythm.     Heart sounds: Normal heart sounds, S1 normal and S2 normal.  Pulmonary:     Breath sounds: Normal breath sounds. No decreased breath sounds, wheezing, rhonchi or rales.  Abdominal:     Palpations: Abdomen is soft.     Tenderness: There is abdominal tenderness in the epigastric area.  Musculoskeletal:     Right lower leg: No swelling.     Left lower leg: No swelling.  Skin:    General: Skin is warm.     Findings: No rash.  Neurological:     Mental Status: She is alert and oriented to person, place, and time.     Data Reviewed: White blood cell count 8.0, hemoglobin 11.1, platelet count 307, creatinine 1.38  Disposition: Status is: Inpatient Remains inpatient appropriate because: Will try to advance diet today and see how things go.  Planned Discharge Destination: Home    Time spent: 29 minutes  Author: Alford Highland, MD 03/31/2023 4:52 PM  For on call review [email or web address].

## 2023-03-31 NOTE — Inpatient Diabetes Management (Signed)
Inpatient Diabetes Program Recommendations  AACE/ADA: New Consensus Statement on Inpatient Glycemic Control (2015)  Target Ranges:  Prepandial:   less than 140 mg/dL      Peak postprandial:   less than 180 mg/dL (1-2 hours)      Critically ill patients:  140 - 180 mg/dL   Lab Results  Component Value Date   GLUCAP 208 (H) 03/31/2023   HGBA1C 8.5 (H) 12/15/2022    Latest Reference Range & Units 03/30/23 08:22 03/30/23 17:15 03/30/23 18:13 03/31/23 07:54 03/31/23 08:37 03/31/23 11:20  Glucose-Capillary 70 - 99 mg/dL 811 (H) Novolog 4 units Semglee 30 units 206 (H) Novolog 7 units 190 (H) 69 (L) 86 208 (H)  (H): Data is abnormally high (L): Data is abnormally low  Review of Glycemic Control  Diabetes history: DM2 Outpatient Diabetes medications: Novolog 8 units bid ac meals, Victoza 1.8 daily, Lantus 58 units (not taking) Current orders for Inpatient glycemic control: Novolog 0-20 units tid, 0-5 units hs  Inpatient Diabetes Program Recommendations:   Spoke with patient regarding medications. Patient was started on Lantus 58 units daily, Novolog 8 units ac meals bid, and Victoza 1.8 mg daily in April 2024. Patient's diabetes is managed by FNP Cleon Dew with last office visit noted on 01/04/23. Patient shared she started having some times of nausea when her blood sugar dropped lower than has been approx 100. Before the insulin was added, her CBGs were > 200's. Patient has lost approximately 25 lbs since started on Victoza in April.  Please consider: -Add Semglee 12 units daily ( 0.15 units/kg x 89.6 kg = 13.44 units) -Decrease Novolog correction to 0-6 units tid, 0-5 units hs -Add Novolog 2 units tid meal coverage if postprandial CBGs >180.  Thank you, Brenda Mccarthy. Brenda Kittell, RN, MSN, CDE  Diabetes Coordinator Inpatient Glycemic Control Team Team Pager [phone] (8am-5pm) 03/31/2023 12:11 PM

## 2023-03-31 NOTE — Assessment & Plan Note (Signed)
Stool studies negative.  As needed Imodium 

## 2023-03-31 NOTE — Hospital Course (Signed)
47 year old female past medical history of insulin-dependent diabetes mellitus, diabetic neuropathy diabetic gastroparesis presenting with worsening GI symptoms with nausea vomiting and diarrhea and abdominal pain.  CT scan was negative.  10/9.  Patient feeling better and wanting to advance to solid food for dinner.  Had quite a few episodes of diarrhea and stool studies were negative. 10/10.  Patient feeling much better.  Tolerated diet last night and ate this morning.  No abdominal pain.  No further diarrhea.

## 2023-03-31 NOTE — Assessment & Plan Note (Signed)
Patient had a low sugar of 69 this morning.  Her long-acting insulin was held Until the afternoon where we started Semglee insulin 12 units daily and sliding scale insulin.

## 2023-03-31 NOTE — Discharge Instructions (Signed)
Gastroparesis Nutrition Therapy  Gastroparesis means that your stomach empties very slowly. This happens when the nerves to your stomach are damaged or do not work properly. This can cause bloating, stomach discomfort or pain, feeling full after eating only a small amount of food, nausea, or vomiting. If you have diabetes in addition to gastroparesis, it is important to control your blood glucose. This will help the stomach empty.  Tips Following these tips may help your stomach empty faster:  Eat small, frequent meals (4 to 6 times per day).  Do not eat solid foods that are high in fat and do not add too much fat to foods. See the Foods Not Recommended table for foods that are high fat.  High-fat solid foods may delay the emptying of your stomach.  Liquids that contain fat, such as milkshakes, may be tolerated and can provide needed calories. Do not eat foods high in fiber. Do not take fiber supplements or fiber bulking agents for constipation.  Do not eat foods that increase acid reflux:  Acidic, spicy, fried and greasy foods  Caffeine  Mint Do not drink alcohol or smoke  Do not drink carbonated beverages, as they increase bloating.  Chew foods well before swallowing. Solid foods in the stomach do not empty well. If you have difficulty tolerating solid foods, ground foods may be better.  If symptoms are severe, semi-solid foods or liquids may need to be your main food sources. Choose liquid nutritional supplements that have less than or equal to 2 grams fiber per serving.  Sit upright while eating and sit upright or walk after meals. Do not lie down for 3 to 4 hours after eating to avoid reflux or regurgitation.  If you wish to nap during the day, nap first and then eat.  Drinking fluids at meals can take up room in your stomach, and you might not get enough calories. At every meal, first eat a grain food and a protein food or dairy product if your body can tolerate it. Drink fluids with  calories. It may be better to delay fluids until after the meal and drink more between meals.   Foods Recommended Food Group Foods Recommended   Grains Choose grain foods with less than 2 grams of fiber per serving; these will be made with white flour Crackers: saltines or graham crackers Cold cereal: puffed rice Cream of rice or wheat Grits (fine ground) Gluten free low fiber foods Pretzels White bread, toasted White rice, cook until very soft  Protein Foods Lean meat and poultry: well-cooked, very tender, moist, and chopped fine Fish: tuna, salmon, or white fish Egg whites, scrambled Peanut butter (limit to 1 tablespoon at a time)  Dairy Milk*, drink 2% if tolerated to get more nutrients or lactose-free 2% milk Fortified non-dairy milks: almond, cashew, coconut, or rice (be aware that these options are not good sources of protein so you will need to eat an additional protein food) Fortified pea milk or soymilk (may cause gas and bloating for some) Instant breakfast* (pre-made lactose-free is sold in bottles) Milkshakes* (try blending in  to  cup canned fruit) Ice cream* (low-fat may be tolerated better; use in milkshakes to increase calories) Frozen yogurt Yogurt* Puddings and custard* Sherbet Liquid nutritional supplements with less than or equal to 2 grams fiber per 1 cup serving *Use lactose-free varieties to reduce gas and bloating  Vegetables Canned and well-cooked vegetables without seeds, skins or hulls Carrots, cooked Mashed potatoes (white, red or yellow) Sweet  potato  Fruit Canned, soft and well-cooked fruits without seeds, skins or membranes Applesauce Banana, mashed may be tolerated better Diced peaches/pears fruit cups in juice Melon, very soft, cut into small pieces Fruit nectar juices  Oils When possible choose oils rather than solid fats Canola or olive oil Margarine  Other Clear soup Gelatin Popsicles   Foods Not Recommended Food Group Foods Not  Recommended   Grains Bran Grains foods with 2 or more grams of fiber per serving: barley, brown rice, kasha, quinoa Popcorn Whole grain and high-fiber cereals, including oats or granola Whole grain bread or pasta  Protein Foods Fried meats, poultry or fish Sausage, bacon or hot dogs Seafood Tough meat, meat with gristle: steak, roast beef or pork chops Beans, peas or lentils Nuts  Dairy Cheese slices Liquid nutritional supplements that have more than 2 grams fiber per serving Pea milk, soymilk (may increase gas and bloating)  Vegetables Raw or undercooked vegetables Alfalfa, asparagus, bean sprouts, broccoli, brussels sprouts, cabbage, cauliflower, corn, green peas or any other kind of peas, lima beans, mushrooms, okra, onions, parsnips, peppers, pickles, potato skins, or spinach  Fruit Fresh fruit except for the ones in the foods recommended table Acidic fruit and juices: oranges/orange juice, grapefruit/grapefruit juice, tomatoes/tomato juice Avocado Berries Coconut Dried fruit Fruit skin Mandarin oranges Pineapple  Oils Fried foods of any type  Other Coffee Olives or pickles Pizza Salsa Sushi   Gastroparesis Sample 1-Day Menu  Breakfast 1 slice white toast (1 carbohydrate serving)  1 teaspoon margarine, soft, tub   cup egg substitute  1 cup peach nectar (2 carbohydrate servings)  Morning Snack Smoothie made with:  small banana (1 carbohydrate serving)  1/3 cup [place] strawberry yogurt ( carbohydrate serving)  1 cup 2% milk (1 carbohydrate serving)  Lunch 2 ounces canned chicken  1 teaspoon mayonnaise  9 saltine crackers (1 carbohydrate servings)   cup applesauce (1 carbohydrate serving)  Afternoon Snack 1 slice white toast (1 carbohydrate serving)  1 tablespoon smooth peanut butter  Evening Meal 2 ounces baked fish   cup mashed potatoes (1 carbohydrate serving)  1 teaspoon olive oil  1 cup 2% milk (1 carbohydrate serving)  Evening Snack 1 packet instant  breakfast (1 carbohydrate servings)  1 cup 2% milk (1 carbohydrate serving)   Gastroparesis Vegetarian (Lacto-Ovo) Sample 1-Day Menu  Breakfast  cup cooked farina (1 carbohydrate serving)   cup egg substitute  2 teaspoons olive oil   cup peach nectar (2 carbohydrate servings)   cup 2% milk ( carbohydrate serving)  Morning Snack 1 slice white toast (1 carbohydrate serving)  1 tablespoon smooth peanut butter  Lunch  cup vegetable soup (1 carbohydrate serving)  9 saltine crackers (1 carbohydrate serving)   cup applesauce (1 carbohydrate serving)   cup 2% milk ( carbohydrate serving)  Afternoon Snack 6 ounces plain yogurt (1 carbohydrate serving)   small banana (1 carbohydrate servings)  Evening Meal  cup baked tofu  2/3 cup white rice (2 carbohydrate servings)  2 teaspoons olive oil   cup 2% milk ( carbohydrate serving)  Evening Snack 1 packet instant breakfast (1 carbohydrate servings)  1 cup 2% milk (1 carbohydrate serving)   Gastroparesis Vegan Sample 1-Day Menu  Breakfast  cup cooked farina (1 carbohydrate serving)  1/3 cup tofu scramble  2 teaspoons olive oil   cup peach nectar (2 carbohydrate servings)   cup almond milk fortified with calcium, vitamin B12, and vitamin D  Morning Snack 1 slice white toast (1  carbohydrate serving)  1 tablespoon smooth peanut butter  Lunch  cup vegetable soup (1 carbohydrate serving)  9 saltine crackers (1 carbohydrate serving)   cup applesauce (1 carbohydrate serving)  Afternoon Snack 6 ounces plain soy yogurt (1 carbohydrate servings)   small banana (1 carbohydrate serving)  Evening Meal  cup baked tofu  2/3 cup white rice (2 carbohydrate servings)  2 teaspoons olive oil   cup almond milk fortified with calcium, vitamin B12, and vitamin D  Evening Snack  scoop soy protein powder ( carbohydrate serving)   cup almond milk fortified with calcium, vitamin B12, and vitamin D   Copyright 2020  [employer]. All rights reserved.  

## 2023-03-31 NOTE — Assessment & Plan Note (Signed)
Creatinine 2.13 on presentation down to 1.38.

## 2023-04-01 DIAGNOSIS — I259 Chronic ischemic heart disease, unspecified: Secondary | ICD-10-CM | POA: Diagnosis not present

## 2023-04-01 DIAGNOSIS — N179 Acute kidney failure, unspecified: Secondary | ICD-10-CM | POA: Diagnosis not present

## 2023-04-01 DIAGNOSIS — R55 Syncope and collapse: Secondary | ICD-10-CM | POA: Diagnosis not present

## 2023-04-01 DIAGNOSIS — K3184 Gastroparesis: Secondary | ICD-10-CM | POA: Diagnosis not present

## 2023-04-01 LAB — GLUCOSE, CAPILLARY: Glucose-Capillary: 169 mg/dL — ABNORMAL HIGH (ref 70–99)

## 2023-04-01 MED ORDER — INSULIN GLARGINE 100 UNIT/ML ~~LOC~~ SOLN: 12.0000 [IU] | Freq: Every day | SUBCUTANEOUS | Status: AC

## 2023-04-01 MED ORDER — INSULIN ASPART 100 UNIT/ML IJ SOLN: 5.0000 [IU] | Freq: Three times a day (TID) | INTRAMUSCULAR | Status: AC

## 2023-04-01 NOTE — Plan of Care (Signed)
  Problem: Education: Goal: Ability to describe self-care measures that may prevent or decrease complications (Diabetes Survival Skills Education) will improve Outcome: Progressing   Problem: Coping: Goal: Ability to adjust to condition or change in health will improve Outcome: Progressing   Problem: Metabolic: Goal: Ability to maintain appropriate glucose levels will improve Outcome: Progressing   Problem: Tissue Perfusion: Goal: Adequacy of tissue perfusion will improve Outcome: Progressing

## 2023-04-01 NOTE — Discharge Summary (Signed)
Physician Discharge Summary   Patient: Brenda Mccarthy MRN: [id number] DOB: Aug 02, 1975  Admit date:     03/30/2023  Discharge date: 04/01/23  Discharge Physician: Alford Highland   PCP: Cleon Dew, FNP   Recommendations at discharge:   Follow-up PCP 5 days  Discharge Diagnoses: Principal Problem:   Gastroparesis Active Problems:   AKI (acute kidney injury) (HCC)   Diarrhea   Uncontrolled type 2 diabetes mellitus with hypoglycemia, with long-term current use of insulin (HCC)   Overweight (BMI 25.0-29.9)   QT prolongation   Myocardial ischemia   Syncope    Hospital Course: 47 year old female past medical history of insulin-dependent diabetes mellitus, diabetic neuropathy diabetic gastroparesis presenting with worsening GI symptoms with nausea vomiting and diarrhea and abdominal pain.  CT scan was negative.  10/9.  Patient feeling better and wanting to advance to solid food for dinner.  Had quite a few episodes of diarrhea and stool studies were negative. 10/10.  Patient feeling much better.  Tolerated diet last night and ate this morning.  No abdominal pain.  No further diarrhea.  Assessment and Plan: * Gastroparesis Patient tolerated advanced diet.  Continue her usual medications Reglan Carafate and Protonix.  AKI (acute kidney injury) (HCC) Creatinine 2.13 on presentation down to 1.38.   Syncope Likely secondary to dehydration.  Myocardial ischemia Demand ischemia from vomiting  QT prolongation Careful with nausea medication.  Overweight (BMI 25.0-29.9) BMI 29.18  Uncontrolled type 2 diabetes mellitus with hypoglycemia, with long-term current use of insulin (HCC) Last hemoglobin A1c 8.5 back in June.  Patient will go back on low-dose Lantus insulin daily and short acting insulin before meals.  She will hold her Victoza just in case this is contributing to the gastroparesis.  Diarrhea Stool studies negative.          Consultants:  None Procedures performed: None Disposition: Home Diet recommendation:  Cardiac and Carb modified diet DISCHARGE MEDICATION: Allergies as of 04/01/2023       Reactions   Bee Venom Anaphylaxis   Metformin Diarrhea, Nausea And Vomiting   Other    Throat swelling with bleach        Medication List     STOP taking these medications    aspirin 81 MG chewable tablet   liraglutide 18 MG/3ML Sopn Commonly known as: VICTOZA   methocarbamol 750 MG tablet Commonly known as: ROBAXIN       TAKE these medications    atorvastatin 40 MG tablet Commonly known as: LIPITOR Take 40 mg by mouth daily.   cetirizine 10 MG tablet Commonly known as: ZYRTEC Take 10 mg by mouth daily.   cimetidine 200 MG tablet Commonly known as: TAGAMET Take 200 mg by mouth daily as needed (heartburn).   diphenhydrAMINE 25 mg capsule Commonly known as: BENADRYL Take 25 mg by mouth every 6 (six) hours as needed for allergies.   FLUoxetine 40 MG capsule Commonly known as: PROZAC Take 40 mg by mouth daily.   insulin aspart 100 UNIT/ML injection Commonly known as: novoLOG Inject 5 Units into the skin 3 (three) times daily with meals. What changed:  how much to take when to take this   insulin glargine 100 UNIT/ML injection Commonly known as: LANTUS Inject 0.12 mLs (12 Units total) into the skin daily. What changed:  how much to take when to take this   metoCLOPramide 10 MG tablet Commonly known as: REGLAN Take 1 tablet (10 mg total) by mouth every 6 (six) hours as needed for nausea.  What changed: when to take this   pantoprazole 40 MG tablet Commonly known as: PROTONIX Take 40 mg by mouth 2 (two) times daily.   pyridostigmine 60 MG tablet Commonly known as: MESTINON Take 60 mg by mouth. With evening meal   sucralfate 1 g tablet Commonly known as: CARAFATE Take 1 g by mouth 4 (four) times daily.   traZODone 50 MG tablet Commonly known as: DESYREL Take 50 mg by mouth at bedtime  as needed for sleep.        Follow-up Information     Cleon Dew, FNP. Go on 04/07/2023.   Specialty: Family Medicine Why: @3 :00 please arrive at 2:45pm. Please bring photo ID and insurance info. Contact information: [address] [place] [address] [phone]                Discharge Exam: Filed Weights   03/30/23 0505  Weight: 89.6 kg   Physical Exam HENT:     Head: Normocephalic.     Mouth/Throat:     Pharynx: No oropharyngeal exudate.  Eyes:     General: Lids are normal.     Conjunctiva/sclera: Conjunctivae normal.  Cardiovascular:     Rate and Rhythm: Normal rate and regular rhythm.     Heart sounds: Normal heart sounds, S1 normal and S2 normal.  Pulmonary:     Breath sounds: Normal breath sounds. No decreased breath sounds, wheezing, rhonchi or rales.  Abdominal:     Palpations: Abdomen is soft.     Tenderness: There is no abdominal tenderness.  Musculoskeletal:     Right lower leg: No swelling.     Left lower leg: No swelling.  Skin:    General: Skin is warm.     Findings: No rash.  Neurological:     Mental Status: She is alert and oriented to person, place, and time.      Condition at discharge: stable  The results of significant diagnostics from this hospitalization (including imaging, microbiology, ancillary and laboratory) are listed below for reference.   Imaging Studies: ECHOCARDIOGRAM COMPLETE  Result Date: 03/30/2023    ECHOCARDIOGRAM REPORT   Patient Name:   Brenda Mccarthy Date of Exam: 03/30/2023 Medical Rec #:  [id number]                  Height:       69.0 in Accession #:    [id number]                 Weight:       197.6 lb Date of Birth:  [birth year]                   BSA:          2.055 m Patient Age:    47 years                   BP:           136/78 mmHg Patient Gender: F                          HR:           100 bpm. Exam Location:  [facility] Procedure: 2D Echo, Cardiac Doppler and Color Doppler Indications:      Elevated troponin  History:         Patient has no prior history of Echocardiogram examinations.  Risk Factors:Diabetes.  Sonographer:     Cristela Blue Referring Phys:  [id number] Emeline General Diagnosing Phys: Yvonne Kendall MD  Sonographer Comments: Technically challenging study due to limited acoustic windows, no apical window and no subcostal window. IMPRESSIONS  1. Left ventricular ejection fraction, by estimation, is 60 to 65%. The left ventricle has normal function. Left ventricular endocardial border not optimally defined to evaluate regional wall motion. There is mild left ventricular hypertrophy. Left ventricular diastolic function could not be evaluated.  2. Right ventricular systolic function is normal. The right ventricular size is normal. Tricuspid regurgitation signal is inadequate for assessing PA pressure.  3. The mitral valve is normal in structure. No evidence of mitral valve regurgitation.  4. The aortic valve is tricuspid. Aortic valve regurgitation not well-assessed. FINDINGS  Left Ventricle: Left ventricular ejection fraction, by estimation, is 60 to 65%. The left ventricle has normal function. Left ventricular endocardial border not optimally defined to evaluate regional wall motion. The left ventricular internal cavity size was normal in size. There is mild left ventricular hypertrophy. Left ventricular diastolic function could not be evaluated. Right Ventricle: The right ventricular size is normal. No increase in right ventricular wall thickness. Right ventricular systolic function is normal. Tricuspid regurgitation signal is inadequate for assessing PA pressure. Left Atrium: Left atrial size was not well visualized. Right Atrium: Right atrial size was not assessed. Pericardium: The pericardium was not well visualized. Mitral Valve: The mitral valve is normal in structure. No evidence of mitral valve regurgitation. Tricuspid Valve: The tricuspid valve is normal in structure.  Tricuspid valve regurgitation is not demonstrated. Aortic Valve: The aortic valve is tricuspid. Aortic valve regurgitation not well-assessed. Pulmonic Valve: The pulmonic valve was not well visualized. Pulmonic valve regurgitation is not visualized. No evidence of pulmonic stenosis. Aorta: The aortic root is normal in size and structure. Pulmonary Artery: The pulmonary artery is of normal size. Venous: The inferior vena cava was not well visualized. IAS/Shunts: The interatrial septum was not assessed.  LEFT VENTRICLE PLAX 2D LVIDd:         4.00 cm LVIDs:         2.30 cm LV PW:         1.10 cm LV IVS:        1.20 cm LVOT diam:     2.00 cm LVOT Area:     3.14 cm  LEFT ATRIUM         Index LA diam:    2.80 cm 1.36 cm/m   AORTA Ao Root diam: 2.70 cm  SHUNTS Systemic Diam: 2.00 cm Yvonne Kendall MD Electronically signed by Yvonne Kendall MD Signature Date/Time: 03/30/2023/2:06:02 PM    Final    CT ABDOMEN PELVIS WO CONTRAST  Result Date: 03/30/2023 CLINICAL DATA:  47 year old female with nausea vomiting for 2 days. Abdominal pain. Fall striking left side. EXAM: CT ABDOMEN AND PELVIS WITHOUT CONTRAST TECHNIQUE: Multidetector CT imaging of the abdomen and pelvis was performed following the standard protocol without IV contrast. RADIATION DOSE REDUCTION: This exam was performed according to the departmental dose-optimization program which includes automated exposure control, adjustment of the mA and/or kV according to patient size and/or use of iterative reconstruction technique. COMPARISON:  CT Abdomen and Pelvis 12/14/2022 and earlier. FINDINGS: Lower chest: Normal heart size and negative lung bases. No pericardial or pleural effusion. Hepatobiliary: Negative noncontrast liver and gallbladder. Pancreas: Negative. Spleen: Negative. Adrenals/Urinary Tract: Negative noncontrast appearance. Incidental pelvic phleboliths. Stomach/Bowel: Decompressed large and small bowel loops throughout the  abdomen and pelvis. Cecum  partially located in the midline. Normal midline appendix series 2, image 61. Stomach is also mostly decompressed. Decompressed duodenum. No free air, free fluid, or mesenteric inflammation identified. Vascular/Lymphatic: Mild Calcified aortic atherosclerosis. Normal caliber abdominal aorta. No lymphadenopathy identified. Reproductive: Stable, negative. Other: No pelvis free fluid. Musculoskeletal: Chronic lumbosacral junction disc and endplate degeneration. Mild chronic lower thoracic spine endplate degeneration and trace vacuum disc. Chronic lower sacral fracture, S5 segment, new since 2022. No acute osseous abnormality identified. IMPRESSION: 1. No acute or inflammatory process identified in the noncontrast abdomen or pelvis. 2. Chronic lower sacral fracture, S5 segment. 3.  Aortic Atherosclerosis (ICD10-I70.0). Electronically Signed   By: Odessa Fleming M.D.   On: 03/30/2023 06:53   CT HEAD WO CONTRAST ( )  Result Date: 03/30/2023 CLINICAL DATA:  47 year old female with nausea vomiting for 2 days. Subsequent fall striking left side of head. EXAM: CT HEAD WITHOUT CONTRAST TECHNIQUE: Contiguous axial images were obtained from the base of the skull through the vertex without intravenous contrast. RADIATION DOSE REDUCTION: This exam was performed according to the departmental dose-optimization program which includes automated exposure control, adjustment of the mA and/or kV according to patient size and/or use of iterative reconstruction technique. COMPARISON:  Orbit CT 11/10/2020. FINDINGS: Brain: Cerebral volume is within normal limits. No midline shift, ventriculomegaly, mass effect, evidence of mass lesion, intracranial hemorrhage or evidence of cortically based acute infarction. Gray-white matter differentiation is within normal limits throughout the brain. Normal basilar cisterns. Vascular: Calcified ICA siphon atherosclerosis at the skull base. No suspicious intracranial vascular hyperdensity. Skull: No  fracture identified. Sinuses/Orbits: Visualized paranasal sinuses and mastoids are stable and well aerated. Other: Patchy left suboccipital soft tissue stranding which could be acute scalp soft tissue injury there. No other acute orbit or scalp soft tissue injury identified. Small area of medial right forehead (series 3, image 31) scarring suspected. IMPRESSION: 1. Possible left suboccipital scalp soft tissue injury, versus scarring. No skull fracture identified. 2. Normal for age noncontrast CT appearance of the Brain. Some ICA siphon calcified atherosclerosis. Electronically Signed   By: Odessa Fleming M.D.   On: 03/30/2023 06:48    Microbiology: Results for orders placed or performed during the hospital encounter of 03/30/23  C Difficile Quick Screen w PCR reflex     Status: None   Collection Time: 03/31/23  2:00 PM   Specimen: STOOL  Result Value Ref Range Status   C Diff antigen NEGATIVE NEGATIVE Final   C Diff toxin NEGATIVE NEGATIVE Final   C Diff interpretation No C. difficile detected.  Final    Comment: Performed at [facility], [address] [place], [place] [address]  Gastrointestinal Panel by PCR , Stool     Status: None   Collection Time: 03/31/23  2:00 PM   Specimen: Stool  Result Value Ref Range Status   Campylobacter species NOT DETECTED NOT DETECTED Final   Plesimonas shigelloides NOT DETECTED NOT DETECTED Final   Salmonella species NOT DETECTED NOT DETECTED Final   Yersinia enterocolitica NOT DETECTED NOT DETECTED Final   Vibrio species NOT DETECTED NOT DETECTED Final   Vibrio cholerae NOT DETECTED NOT DETECTED Final   Enteroaggregative E coli (EAEC) NOT DETECTED NOT DETECTED Final   Enteropathogenic E coli (EPEC) NOT DETECTED NOT DETECTED Final   Enterotoxigenic E coli (ETEC) NOT DETECTED NOT DETECTED Final   Shiga like toxin producing E coli (STEC) NOT DETECTED NOT DETECTED Final   Shigella/Enteroinvasive E coli (EIEC) NOT DETECTED  NOT DETECTED Final    Cryptosporidium NOT DETECTED NOT DETECTED Final   Cyclospora cayetanensis NOT DETECTED NOT DETECTED Final   Entamoeba histolytica NOT DETECTED NOT DETECTED Final   Giardia lamblia NOT DETECTED NOT DETECTED Final   Adenovirus F40/41 NOT DETECTED NOT DETECTED Final   Astrovirus NOT DETECTED NOT DETECTED Final   Norovirus GI/GII NOT DETECTED NOT DETECTED Final   Rotavirus A NOT DETECTED NOT DETECTED Final   Sapovirus (I, II, IV, and V) NOT DETECTED NOT DETECTED Final    Comment: Performed at [facility], [address] [place], [place] [address]    Labs: CBC: Recent Labs  Lab 03/30/23 0510 03/31/23 0337  WBC 15.5* 8.0  NEUTROABS 12.8*  --   HGB 15.7* 11.1*  HCT 46.7* 33.5*  MCV 82.4 82.3  PLT 462* 307   Basic Metabolic Panel: Recent Labs  Lab 03/30/23 0510 03/31/23 0337  NA 135 138  K 4.1 3.9  CL 95* 107  CO2 22 22  GLUCOSE 307* 74  BUN 33* 31*  CREATININE 2.13* 1.38*  CALCIUM 10.0 8.2*   Liver Function Tests: Recent Labs  Lab 03/30/23 0510  AST 35  ALT 31  ALKPHOS 173*  BILITOT 1.0  PROT 10.2*  ALBUMIN 5.0   CBG: Recent Labs  Lab 03/31/23 0837 03/31/23 1120 03/31/23 1745 03/31/23 2131 04/01/23 0822  GLUCAP 86 208* 105* 124* 169*    Discharge time spent: greater than 30 minutes.  Signed: Alford Highland, MD [facility] 04/01/2023

## 2023-08-29 ENCOUNTER — Inpatient Hospital Stay
Admission: EM | Admit: 2023-08-29 | Discharge: 2023-08-31 | DRG: 074 | Disposition: A | Discharge status: Home / Self Care | Attending: Internal Medicine | Admitting: Internal Medicine

## 2023-08-29 ENCOUNTER — Other Ambulatory Visit: Payer: Self-pay

## 2023-08-29 ENCOUNTER — Observation Stay (HOSPITAL_COMMUNITY)
Admit: 2023-08-29 | Discharge: 2023-08-29 | Disposition: A | Discharge status: Home / Self Care | Attending: Internal Medicine

## 2023-08-29 DIAGNOSIS — R739 Hyperglycemia, unspecified: Secondary | ICD-10-CM

## 2023-08-29 DIAGNOSIS — K3184 Gastroparesis: Secondary | ICD-10-CM | POA: Diagnosis not present

## 2023-08-29 DIAGNOSIS — E874 Mixed disorder of acid-base balance: Secondary | ICD-10-CM | POA: Diagnosis present

## 2023-08-29 DIAGNOSIS — R9431 Abnormal electrocardiogram [ECG] [EKG]: Secondary | ICD-10-CM | POA: Diagnosis present

## 2023-08-29 DIAGNOSIS — J449 Chronic obstructive pulmonary disease, unspecified: Secondary | ICD-10-CM | POA: Diagnosis present

## 2023-08-29 DIAGNOSIS — E1122 Type 2 diabetes mellitus with diabetic chronic kidney disease: Secondary | ICD-10-CM | POA: Diagnosis present

## 2023-08-29 DIAGNOSIS — Z79899 Other long term (current) drug therapy: Secondary | ICD-10-CM

## 2023-08-29 DIAGNOSIS — R7989 Other specified abnormal findings of blood chemistry: Secondary | ICD-10-CM | POA: Insufficient documentation

## 2023-08-29 DIAGNOSIS — Z833 Family history of diabetes mellitus: Secondary | ICD-10-CM

## 2023-08-29 DIAGNOSIS — E785 Hyperlipidemia, unspecified: Secondary | ICD-10-CM | POA: Diagnosis present

## 2023-08-29 DIAGNOSIS — I340 Nonrheumatic mitral (valve) insufficiency: Secondary | ICD-10-CM | POA: Diagnosis not present

## 2023-08-29 DIAGNOSIS — N179 Acute kidney failure, unspecified: Secondary | ICD-10-CM | POA: Diagnosis present

## 2023-08-29 DIAGNOSIS — Z794 Long term (current) use of insulin: Secondary | ICD-10-CM

## 2023-08-29 DIAGNOSIS — Z8249 Family history of ischemic heart disease and other diseases of the circulatory system: Secondary | ICD-10-CM

## 2023-08-29 DIAGNOSIS — N1831 Chronic kidney disease, stage 3a: Secondary | ICD-10-CM | POA: Diagnosis present

## 2023-08-29 DIAGNOSIS — E876 Hypokalemia: Secondary | ICD-10-CM | POA: Diagnosis present

## 2023-08-29 DIAGNOSIS — E1165 Type 2 diabetes mellitus with hyperglycemia: Secondary | ICD-10-CM | POA: Diagnosis present

## 2023-08-29 DIAGNOSIS — I2510 Atherosclerotic heart disease of native coronary artery without angina pectoris: Secondary | ICD-10-CM | POA: Diagnosis present

## 2023-08-29 DIAGNOSIS — Z87891 Personal history of nicotine dependence: Secondary | ICD-10-CM

## 2023-08-29 DIAGNOSIS — R112 Nausea with vomiting, unspecified: Principal | ICD-10-CM | POA: Diagnosis present

## 2023-08-29 DIAGNOSIS — E1143 Type 2 diabetes mellitus with diabetic autonomic (poly)neuropathy: Principal | ICD-10-CM | POA: Diagnosis present

## 2023-08-29 LAB — CBG MONITORING, ED
Glucose-Capillary: 369 mg/dL — ABNORMAL HIGH (ref 70–99)
Glucose-Capillary: 360 mg/dL — ABNORMAL HIGH (ref 70–99)
Glucose-Capillary: 274 mg/dL — ABNORMAL HIGH (ref 70–99)
Glucose-Capillary: 172 mg/dL — ABNORMAL HIGH (ref 70–99)

## 2023-08-29 LAB — ECHOCARDIOGRAM COMPLETE
AR max vel: 2.65 cm2
AV Peak grad: 6.1 mmHg
Ao pk vel: 1.24 m/s
Height: 69 in
S' Lateral: 3 cm
Weight: 3072 [oz_av]

## 2023-08-29 LAB — CBC
HCT: 45 % (ref 36.0–46.0)
Hemoglobin: 14.9 g/dL (ref 12.0–15.0)
MCH: 28 pg (ref 26.0–34.0)
MCHC: 33.1 g/dL (ref 30.0–36.0)
MCV: 84.6 fL (ref 80.0–100.0)
Platelets: 366 10*3/uL (ref 150–400)
RBC: 5.32 MIL/uL — ABNORMAL HIGH (ref 3.87–5.11)
RDW: 14.3 % (ref 11.5–15.5)
WBC: 14.9 10*3/uL — ABNORMAL HIGH (ref 4.0–10.5)
nRBC: 0 % (ref 0.0–0.2)

## 2023-08-29 LAB — BLOOD GAS, VENOUS
Acid-Base Excess: 1.6 mmol/L (ref 0.0–2.0)
Bicarbonate: 24.6 mmol/L (ref 20.0–28.0)
O2 Saturation: 64.1 %
Patient temperature: 37
pCO2, Ven: 33 mmHg — ABNORMAL LOW (ref 44–60)
pH, Ven: 7.48 — ABNORMAL HIGH (ref 7.25–7.43)
pO2, Ven: 39 mmHg (ref 32–45)

## 2023-08-29 LAB — HEMOGLOBIN A1C
Hgb A1c MFr Bld: 8.6 % — ABNORMAL HIGH (ref 4.8–5.6)
Mean Plasma Glucose: 200.12 mg/dL

## 2023-08-29 LAB — CK: Total CK: 136 U/L (ref 38–234)

## 2023-08-29 LAB — TROPONIN I (HIGH SENSITIVITY)
Troponin I (High Sensitivity): 704 ng/L (ref <18)
Troponin I (High Sensitivity): 794 ng/L (ref <18)
Troponin I (High Sensitivity): 402 ng/L (ref <18)

## 2023-08-29 LAB — COMPREHENSIVE METABOLIC PANEL
ALT: 58 U/L — ABNORMAL HIGH (ref 0–44)
AST: 37 U/L (ref 15–41)
Albumin: 4.3 g/dL (ref 3.5–5.0)
Alkaline Phosphatase: 222 U/L — ABNORMAL HIGH (ref 38–126)
Anion gap: 16 — ABNORMAL HIGH (ref 5–15)
BUN: 38 mg/dL — ABNORMAL HIGH (ref 6–20)
CO2: 23 mmol/L (ref 22–32)
Calcium: 9.4 mg/dL (ref 8.9–10.3)
Chloride: 91 mmol/L — ABNORMAL LOW (ref 98–111)
Creatinine, Ser: 1.79 mg/dL — ABNORMAL HIGH (ref 0.44–1.00)
GFR, Estimated: 34 mL/min — ABNORMAL LOW (ref >60)
Glucose, Bld: 378 mg/dL — ABNORMAL HIGH (ref 70–99)
Potassium: 3.3 mmol/L — ABNORMAL LOW (ref 3.5–5.1)
Sodium: 130 mmol/L — ABNORMAL LOW (ref 135–145)
Total Bilirubin: 1.1 mg/dL (ref 0.0–1.2)
Total Protein: 8.9 g/dL — ABNORMAL HIGH (ref 6.5–8.1)

## 2023-08-29 LAB — BETA-HYDROXYBUTYRIC ACID: Beta-Hydroxybutyric Acid: 1.62 mmol/L — ABNORMAL HIGH (ref 0.05–0.27)

## 2023-08-29 LAB — APTT: aPTT: 26 s (ref 24–36)

## 2023-08-29 LAB — LIPASE, BLOOD: Lipase: 51 U/L (ref 11–51)

## 2023-08-29 LAB — PROTIME-INR
INR: 1 (ref 0.8–1.2)
Prothrombin Time: 13.6 s (ref 11.4–15.2)

## 2023-08-29 LAB — MAGNESIUM: Magnesium: 1.5 mg/dL — ABNORMAL LOW (ref 1.7–2.4)

## 2023-08-29 MED ORDER — POTASSIUM CHLORIDE 10 MEQ/100ML IV SOLN
10.0000 meq | Freq: Once | INTRAVENOUS | Status: AC
  Administered 2023-08-29: 10 meq via INTRAVENOUS
  Filled 2023-08-29: qty 100

## 2023-08-29 MED ORDER — FAMOTIDINE 20 MG PO TABS
10.0000 mg | ORAL_TABLET | Freq: Every day | ORAL | Status: DC
  Administered 2023-08-30 – 2023-08-31 (×2): 10 mg via ORAL
  Filled 2023-08-29 (×2): qty 1

## 2023-08-29 MED ORDER — HEPARIN (PORCINE) 25000 UT/250ML-% IV SOLN
1100.0000 [IU]/h | INTRAVENOUS | Status: DC
  Administered 2023-08-29: 1100 [IU]/h via INTRAVENOUS
  Filled 2023-08-29: qty 250

## 2023-08-29 MED ORDER — METOCLOPRAMIDE HCL 5 MG/ML IJ SOLN
10.0000 mg | Freq: Four times a day (QID) | INTRAMUSCULAR | Status: AC
  Administered 2023-08-29 – 2023-08-30 (×4): 10 mg via INTRAVENOUS
  Filled 2023-08-29 (×4): qty 2

## 2023-08-29 MED ORDER — DIPHENHYDRAMINE HCL 25 MG PO CAPS: 25.0000 mg | ORAL_CAPSULE | Freq: Four times a day (QID) | ORAL | Status: DC | PRN

## 2023-08-29 MED ORDER — ENOXAPARIN SODIUM 40 MG/0.4ML IJ SOSY
40.0000 mg | PREFILLED_SYRINGE | INTRAMUSCULAR | Status: DC
Start: 2023-08-29 — End: 2023-08-29

## 2023-08-29 MED ORDER — METOCLOPRAMIDE HCL 5 MG/ML IJ SOLN
10.0000 mg | Freq: Once | INTRAMUSCULAR | Status: AC
Start: 2023-08-29 — End: 2023-08-29
  Administered 2023-08-29: 10 mg via INTRAVENOUS
  Filled 2023-08-29: qty 2

## 2023-08-29 MED ORDER — ASPIRIN 81 MG PO CHEW
324.0000 mg | CHEWABLE_TABLET | Freq: Once | ORAL | Status: AC
Start: 2023-08-29 — End: 2023-08-29
  Administered 2023-08-29: 324 mg via ORAL
  Filled 2023-08-29: qty 4

## 2023-08-29 MED ORDER — PROCHLORPERAZINE EDISYLATE 10 MG/2ML IJ SOLN
10.0000 mg | Freq: Four times a day (QID) | INTRAMUSCULAR | Status: DC | PRN
  Administered 2023-08-29 – 2023-08-30 (×5): 10 mg via INTRAVENOUS
  Filled 2023-08-29 (×6): qty 2

## 2023-08-29 MED ORDER — HEPARIN (PORCINE) 25000 UT/250ML-% IV SOLN
1300.0000 [IU]/h | INTRAVENOUS | Status: DC
  Administered 2023-08-29: 1100 [IU]/h via INTRAVENOUS
  Filled 2023-08-29: qty 250

## 2023-08-29 MED ORDER — INSULIN GLARGINE 100 UNIT/ML ~~LOC~~ SOLN
12.0000 [IU] | Freq: Every day | SUBCUTANEOUS | Status: DC
  Administered 2023-08-29 – 2023-08-30 (×2): 12 [IU] via SUBCUTANEOUS
  Filled 2023-08-29 (×3): qty 0.12

## 2023-08-29 MED ORDER — LORAZEPAM 2 MG/ML IJ SOLN
1.0000 mg | Freq: Once | INTRAMUSCULAR | Status: AC
  Administered 2023-08-29: 1 mg via INTRAVENOUS
  Filled 2023-08-29: qty 1

## 2023-08-29 MED ORDER — SODIUM CHLORIDE 0.9 % IV BOLUS
500.0000 mL | Freq: Once | INTRAVENOUS | Status: AC
  Administered 2023-08-29: 500 mL via INTRAVENOUS

## 2023-08-29 MED ORDER — MORPHINE SULFATE (PF) 4 MG/ML IV SOLN: 8.0000 mg | Freq: Once | INTRAVENOUS | Status: DC

## 2023-08-29 MED ORDER — SODIUM CHLORIDE 0.9 % IV SOLN: INTRAVENOUS | Status: AC

## 2023-08-29 MED ORDER — SODIUM CHLORIDE 0.9 % IV BOLUS
1000.0000 mL | Freq: Once | INTRAVENOUS | Status: AC
  Administered 2023-08-29: 1000 mL via INTRAVENOUS

## 2023-08-29 MED ORDER — FLUOXETINE HCL 20 MG PO CAPS
40.0000 mg | ORAL_CAPSULE | Freq: Every day | ORAL | Status: DC
  Administered 2023-08-29 – 2023-08-31 (×3): 40 mg via ORAL
  Filled 2023-08-29 (×3): qty 2

## 2023-08-29 MED ORDER — MORPHINE SULFATE (PF) 4 MG/ML IV SOLN
4.0000 mg | Freq: Once | INTRAVENOUS | Status: AC
  Administered 2023-08-29: 4 mg via INTRAVENOUS
  Filled 2023-08-29: qty 1

## 2023-08-29 MED ORDER — ATORVASTATIN CALCIUM 20 MG PO TABS
40.0000 mg | ORAL_TABLET | Freq: Every day | ORAL | Status: DC
  Administered 2023-08-29 – 2023-08-31 (×3): 40 mg via ORAL
  Filled 2023-08-29 (×3): qty 2

## 2023-08-29 MED ORDER — INSULIN ASPART 100 UNIT/ML IJ SOLN
0.0000 [IU] | Freq: Three times a day (TID) | INTRAMUSCULAR | Status: DC
  Administered 2023-08-29: 15 [IU] via SUBCUTANEOUS
  Administered 2023-08-29: 8 [IU] via SUBCUTANEOUS
  Administered 2023-08-30 (×2): 5 [IU] via SUBCUTANEOUS
  Filled 2023-08-29 (×4): qty 1

## 2023-08-29 MED ORDER — PYRIDOSTIGMINE BROMIDE 60 MG PO TABS: 60.0000 mg | ORAL_TABLET | Freq: Every evening | ORAL | Status: DC

## 2023-08-29 MED ORDER — MAGNESIUM SULFATE IN D5W 1-5 GM/100ML-% IV SOLN
1.0000 g | Freq: Once | INTRAVENOUS | Status: AC
  Administered 2023-08-29: 1 g via INTRAVENOUS
  Filled 2023-08-29 (×2): qty 100

## 2023-08-29 MED ORDER — LORATADINE 10 MG PO TABS
10.0000 mg | ORAL_TABLET | Freq: Every day | ORAL | Status: DC
  Administered 2023-08-29 – 2023-08-31 (×3): 10 mg via ORAL
  Filled 2023-08-29 (×3): qty 1

## 2023-08-29 MED ORDER — POTASSIUM CHLORIDE CRYS ER 20 MEQ PO TBCR
40.0000 meq | EXTENDED_RELEASE_TABLET | Freq: Once | ORAL | Status: AC
  Administered 2023-08-29: 40 meq via ORAL
  Filled 2023-08-29: qty 2

## 2023-08-29 MED ORDER — SUCRALFATE 1 G PO TABS
1.0000 g | ORAL_TABLET | Freq: Four times a day (QID) | ORAL | Status: DC
  Administered 2023-08-29 – 2023-08-31 (×8): 1 g via ORAL
  Filled 2023-08-29 (×8): qty 1

## 2023-08-29 MED ORDER — MORPHINE SULFATE (PF) 2 MG/ML IV SOLN
2.0000 mg | INTRAVENOUS | Status: DC | PRN
  Administered 2023-08-29 – 2023-08-30 (×6): 2 mg via INTRAVENOUS
  Filled 2023-08-29 (×6): qty 1

## 2023-08-29 MED ORDER — PANTOPRAZOLE SODIUM 40 MG PO TBEC
40.0000 mg | DELAYED_RELEASE_TABLET | Freq: Two times a day (BID) | ORAL | Status: DC
  Administered 2023-08-29 – 2023-08-31 (×5): 40 mg via ORAL
  Filled 2023-08-29 (×5): qty 1

## 2023-08-29 MED ORDER — HEPARIN BOLUS VIA INFUSION
4000.0000 [IU] | Freq: Once | INTRAVENOUS | Status: AC
  Administered 2023-08-29: 4000 [IU] via INTRAVENOUS
  Filled 2023-08-29: qty 4000

## 2023-08-29 MED ORDER — TRAZODONE HCL 50 MG PO TABS: 50.0000 mg | ORAL_TABLET | Freq: Every evening | ORAL | Status: DC | PRN

## 2023-08-29 MED ORDER — NITROGLYCERIN 0.4 MG SL SUBL: 0.4000 mg | SUBLINGUAL_TABLET | SUBLINGUAL | Status: DC | PRN

## 2023-08-29 NOTE — Consult Note (Signed)
 PHARMACY - ANTICOAGULATION CONSULT NOTE  Pharmacy Consult for Heparin Infusion Indication: chest pain/ACS  Allergies  Allergen Reactions   Bee Venom Anaphylaxis   Metformin Diarrhea and Nausea And Vomiting   Other     Throat swelling with bleach    Patient Measurements: Height: 5\' 9"  (175.3 cm) Weight: 87.1 kg (192 lb) IBW/kg (Calculated) : 66.2 Heparin Dosing Weight: 84.1  Vital Signs: Temp: 97.8 F (36.6 C) (03/09 1541) Temp Source: Oral (03/09 1541) BP: 101/67 (03/09 1730) Pulse Rate: 85 (03/09 1730)  Labs: Recent Labs    08/29/23 0756 08/29/23 1040  HGB 14.9  --   HCT 45.0  --   PLT 366  --   APTT 26  --   LABPROT 13.6  --   INR 1.0  --   CREATININE 1.79*  --   CKTOTAL  --  136  TROPONINIHS 704* 794*    Estimated Creatinine Clearance: 44.8 mL/min (A) (by C-G formula based on SCr of 1.79 mg/dL (H)).   Medical History: Past Medical History:  Diagnosis Date   Diabetes mellitus without complication (HCC)    Diabetic neuropathy (HCC)    Gastroparesis    Hydradenitis    Migraine    Assessment: Brenda Mccarthy is a 48 y.o. female presenting with persistent nausea and vomiting. PMH significant for T2DM, HLD, depression, CAD. Patient was not on [facility] PTA per chart review. Patient was started on heparin infusion the morning of 3/9 which was subsequently discontinued after ~5 hours. Pharmacy has been consulted to re-initiate and manage heparin infusion.   Baseline Labs: aPTT 26, PT 13.6, INR 1.0, Hgb 14.9, Hct 45.0, Plt 366   Goal of Therapy:  Heparin level 0.3-0.7 units/ml Monitor platelets by anticoagulation protocol: Yes   Plan:  Resume heparin infusion at 1100 units/hr without bolus Check HL in 6 hours  Continue to monitor H&H and platelets daily while on heparin infusion   Celene Squibb, PharmD Clinical Pharmacist 08/29/2023 8:11 PM

## 2023-08-29 NOTE — Consult Note (Signed)
 PHARMACY - ANTICOAGULATION CONSULT NOTE  Pharmacy Consult for Heparin Indication: chest pain/ACS  Allergies  Allergen Reactions   Bee Venom Anaphylaxis   Metformin Diarrhea and Nausea And Vomiting   Other     Throat swelling with bleach   Patient Measurements: Height: 5\' 9"  (175.3 cm) Weight: 87.1 kg (192 lb) IBW/kg (Calculated) : 66.2 Heparin Dosing Weight: 84.1 kg   Vital Signs: Temp: 98 F (36.7 C) (03/09 0743) BP: 164/89 (03/09 0900) Pulse Rate: 84 (03/09 0900)  Labs: Recent Labs    08/29/23 0756  HGB 14.9  HCT 45.0  PLT 366  CREATININE 1.79*  TROPONINIHS 704*   Estimated Creatinine Clearance: 44.8 mL/min (A) (by C-G formula based on SCr of 1.79 mg/dL (H)).  Medical History: Past Medical History:  Diagnosis Date   Diabetes mellitus without complication (HCC)    Diabetic neuropathy (HCC)    Gastroparesis    Hydradenitis    Migraine    Medications:  No PTA anticoagulation   Assessment: Brenda Mccarthy is a 48 year old female that presented with persistent nausea and vomiting. Patient with elevated troponins of 704 > 794. From chart review, patient was not on any anticoagulation PTA. Pharmacy has been consulted for initiation and management of a heparin infusion for an NSTEMI. Baseline labs: Hgb 14.9, PLT 366, aPTT and INR has been ordered.   Goal of Therapy:  Heparin level 0.3-0.7 units/ml Monitor platelets by anticoagulation protocol: Yes   Plan:  Give 4000 unit bolus  Start heparin infusion at 1100 units/hr  Check anti-Xa level 6 hours after initiation of infusion  Monitor CBC daily and for s/sx of bleeding   Littie Deeds, PharmD Pharmacy Resident  08/29/2023 9:34 AM

## 2023-08-29 NOTE — H&P (Addendum)
 History and Physical    Tanvi Gatling EXB:[id number] DOB: [birth year] DOA: 08/29/2023  PCP: Cleon Dew, FNP (Confirm with patient/family/NH records and if not entered, this has to be entered at [facility] point of entry) Patient coming from: Home  I have personally briefly reviewed patient's old medical records in [facility]  Chief Complaint: Nausea with vomiting  HPI: Brenda Mccarthy is a 48 y.o. female with medical history significant of IDDM, gastroparesis, diabetic neuropathy, nonobstructive CAD, CKD stage IIIa presented with persistent nausea with vomiting.  Patient has history of severe gastroparesis, for which she takes Reglan for last 2 years, sucralfate and PPI and pyridostigmine and has been followed with [facility] gastroparesis clinic.  1 month ago she ran out of Reglan and did not get refill.  2 days ago, she ate some oily food and in the evening she started to feel cramping-like abdominal pain with initial 1 episode of diarrhea than severe nauseous vomiting since.  Symptoms similar to her previous episode of gastroparesis.  This morning she also has had some burning-like pain in the middle of her chest associated with nauseous vomiting.  She could only tolerate soup and food since yesterday.  This morning she found her glucose high and decided to come to ED.  Denies any fever chill, no more diarrhea since Friday.  ED Course: Afebrile, none tachycardia blood pressure 160/80 O2 saturation 100% on room air.  Blood work showed creatinine 1.7, glucose 378, BUN 38K 3.3, WBC 14.9 hemoglobin 14.9, VBG 7.48/33/39.  EKG initially showed T wave flattening on lateral leads repeat EKG showed no significant ST changes.  Troponin 704.  Patient was started on heparin drip in ED.  Review of Systems: As per HPI otherwise 14 point review of systems negative.    Past Medical History:  Diagnosis Date   Diabetes mellitus without complication (HCC)    Diabetic neuropathy (HCC)     Gastroparesis    Hydradenitis    Migraine     Past Surgical History:  Procedure Laterality Date   ANTERIOR CRUCIATE LIGAMENT REPAIR     TUBAL LIGATION       reports that she has quit smoking. She has never used smokeless tobacco. She reports that she does not currently use alcohol. She reports current drug use. Drug: Marijuana.  Allergies  Allergen Reactions   Bee Venom Anaphylaxis   Metformin Diarrhea and Nausea And Vomiting   Other     Throat swelling with bleach    Family History  Problem Relation Age of Onset   Bipolar disorder Mother    Diabetes Mother    Heart disease Mother      Prior to Admission medications   Medication Sig Start Date End Date Taking? Authorizing Provider  atorvastatin (LIPITOR) 40 MG tablet Take 40 mg by mouth daily.    [provider]  cetirizine (ZYRTEC) 10 MG tablet Take 10 mg by mouth daily.    [provider]  cimetidine (TAGAMET) 200 MG tablet Take 200 mg by mouth daily as needed (heartburn).    [provider]  diphenhydrAMINE (BENADRYL) 25 mg capsule Take 25 mg by mouth every 6 (six) hours as needed for allergies.    [provider]  FLUoxetine (PROZAC) 40 MG capsule Take 40 mg by mouth daily.    [provider]  insulin aspart (NOVOLOG) 100 UNIT/ML injection Inject 5 Units into the skin 3 (three) times daily with meals. 04/01/23   Alford Highland, MD  insulin glargine (LANTUS)  100 UNIT/ML injection Inject 0.12 mLs (12 Units total) into the skin daily. 04/01/23   Alford Highland, MD  metoCLOPramide (REGLAN) 10 MG tablet Take 1 tablet (10 mg total) by mouth every 6 (six) hours as needed for nausea. Patient taking differently: Take 10 mg by mouth in the morning, at noon, in the evening, and at bedtime. 12/05/20   Chesley Noon, MD  pantoprazole (PROTONIX) 40 MG tablet Take 40 mg by mouth 2 (two) times daily.    [provider]  pyridostigmine (MESTINON) 60 MG tablet Take 60 mg by  mouth. With evening meal 03/18/23 03/17/24  [provider]  sucralfate (CARAFATE) 1 g tablet Take 1 g by mouth 4 (four) times daily.    [provider]  traZODone (DESYREL) 50 MG tablet Take 50 mg by mouth at bedtime as needed for sleep.    [provider]    Physical Exam: Vitals:   08/29/23 0743 08/29/23 0900 08/29/23 1000 08/29/23 1030  BP: (!) 163/104 (!) 164/89 (!) 161/87 105/67  Pulse: (!) 101 84 86 85  Resp: 19 16 17  (!) 21  Temp: 98 F (36.7 C)     SpO2: 100% 100% 100% 100%  Weight:      Height:        Constitutional: NAD, calm, comfortable Vitals:   08/29/23 0743 08/29/23 0900 08/29/23 1000 08/29/23 1030  BP: (!) 163/104 (!) 164/89 (!) 161/87 105/67  Pulse: (!) 101 84 86 85  Resp: 19 16 17  (!) 21  Temp: 98 F (36.7 C)     SpO2: 100% 100% 100% 100%  Weight:      Height:       Eyes: PERRL, lids and conjunctivae normal ENMT: Mucous membranes are moist. Posterior pharynx clear of any exudate or lesions.Normal dentition.  Neck: normal, supple, no masses, no thyromegaly Respiratory: clear to auscultation bilaterally, no wheezing, no crackles. Normal respiratory effort. No accessory muscle use.  Cardiovascular: Regular rate and rhythm, no murmurs / rubs / gallops. No extremity edema. 2+ pedal pulses. No carotid bruits.  Abdomen: no tenderness, no masses palpated. No hepatosplenomegaly. Bowel sounds positive.  Musculoskeletal: no clubbing / cyanosis. No joint deformity upper and lower extremities. Good ROM, no contractures. Normal muscle tone.  Skin: no rashes, lesions, ulcers. No induration Neurologic: CN 2-12 grossly intact. Sensation intact, DTR normal. Strength 5/5 in all 4.  Psychiatric: Normal judgment and insight. Alert and oriented x 3. Normal mood.     Labs on Admission: I have personally reviewed following labs and imaging studies  CBC: Recent Labs  Lab 08/29/23 0756  WBC 14.9*  HGB 14.9  HCT 45.0  MCV 84.6  PLT 366   Basic  Metabolic Panel: Recent Labs  Lab 08/29/23 0756  NA 130*  K 3.3*  CL 91*  CO2 23  GLUCOSE 378*  BUN 38*  CREATININE 1.79*  CALCIUM 9.4  MG 1.5*   GFR: Estimated Creatinine Clearance: 44.8 mL/min (A) (by C-G formula based on SCr of 1.79 mg/dL (H)). Liver Function Tests: Recent Labs  Lab 08/29/23 0756  AST 37  ALT 58*  ALKPHOS 222*  BILITOT 1.1  PROT 8.9*  ALBUMIN 4.3   Recent Labs  Lab 08/29/23 0756  LIPASE 51   No results for input(s): "AMMONIA" in the last 168 hours. Coagulation Profile: Recent Labs  Lab 08/29/23 0756  INR 1.0   Cardiac Enzymes: No results for input(s): "CKTOTAL", "CKMB", "CKMBINDEX", "TROPONINI" in the last 168 hours. BNP (last 3 results) No  results for input(s): "PROBNP" in the last 8760 hours. HbA1C: No results for input(s): "HGBA1C" in the last 72 hours. CBG: Recent Labs  Lab 08/29/23 1005  GLUCAP 369*   Lipid Profile: No results for input(s): "CHOL", "HDL", "LDLCALC", "TRIG", "CHOLHDL", "LDLDIRECT" in the last 72 hours. Thyroid Function Tests: No results for input(s): "TSH", "T4TOTAL", "FREET4", "T3FREE", "THYROIDAB" in the last 72 hours. Anemia Panel: No results for input(s): "VITAMINB12", "FOLATE", "FERRITIN", "TIBC", "IRON", "RETICCTPCT" in the last 72 hours. Urine analysis:    Component Value Date/Time   COLORURINE YELLOW (A) 03/30/2023 0900   APPEARANCEUR HAZY (A) 03/30/2023 0900   APPEARANCEUR CLOUDY 05/29/2013 1801   LABSPEC 1.008 03/30/2023 0900   LABSPEC >=1.030 05/29/2013 1801   PHURINE 6.0 03/30/2023 0900   GLUCOSEU NEGATIVE 03/30/2023 0900   GLUCOSEU NEGATIVE 05/29/2013 1801   HGBUR MODERATE (A) 03/30/2023 0900   BILIRUBINUR NEGATIVE 03/30/2023 0900   BILIRUBINUR NEGATIVE 05/29/2013 1801   KETONESUR NEGATIVE 03/30/2023 0900   PROTEINUR NEGATIVE 03/30/2023 0900   NITRITE NEGATIVE 03/30/2023 0900   LEUKOCYTESUR MODERATE (A) 03/30/2023 0900   LEUKOCYTESUR 2+ 05/29/2013 1801    Radiological Exams on  Admission: No results found.  EKG: Independently reviewed.  Sinus rhythm, no acute ST changes.    Assessment/Plan Principal Problem:   Gastroparesis Active Problems:   Elevated troponin  (please populate well all problems here in Problem List. (For example, if patient is on BP meds at home and you resume or decide to hold them, it is a problem that needs to be her. Same for CAD, COPD, HLD and so on)  Elevated troponins History of nonobstructive CAD -Single episode of atypical chest pain this morning.  Initial troponin elevated, will trend troponins.  Patient has no chest pain right now. -Echocardiogram -Noted that the patient had a recent CTA of coronary artery in January 26 for previous syncope episode hospitalization, which showed less than 10% of stenosis of coronary arteries.  Clinically suspect coronary artery spasm.  Discussed with on-call cardiology Dr. Mariah Milling, this opinion is likely patient had demanding ischemia, ACS unlikely.  Will discontinue heparin drip.  Decompensated starvation ketoacidosis and respiratory alkalosis -Subcu insulin  Flareup of gastroparesis Acute nauseous vomiting -Abdominal exam benign -Has been able to tolerate liquid diet -Resume Reglan every 6 hours IV x 4 doses then consider switch to p.o. Reglan once able to tolerate more p.o. tomorrow. -As needed Compazine -Hold off pyridostigmine -Outpatient follow-up with [facility] gastroparesis clinic  CKD stage IIIa -Euvolemic, creatinine level stable  Prolonged QTc punctation -Repeat EKG tomorrow  DVT prophylaxis: Heparin drip Code Status: Full code Family Communication: None at bedside Disposition Plan: Expect less than 2 midnight hospital stay Consults called: None Admission status: Telemetry observation   Emeline General MD [facility] Pager [phone]  08/29/2023, 10:38 AM

## 2023-08-29 NOTE — ED Notes (Signed)
 Critical Result: trop 704  Mumma, MD made aware

## 2023-08-29 NOTE — ED Notes (Signed)
 Called CCMD at [phone] to confirm cardiac monitoring is underway.

## 2023-08-29 NOTE — ED Triage Notes (Addendum)
 Pt comes with c/o vomiting, diarrhea and belly pain. Pt states hx of gastroparesis. Pt is also diabetic and states sugars have been in 300s. Pt still taking insulin for this and hasn't missed any doses.

## 2023-08-29 NOTE — ED Notes (Signed)
 Lab called at this time to add on trop

## 2023-08-29 NOTE — ED Notes (Signed)
 CCMD called at this time

## 2023-08-29 NOTE — ED Provider Notes (Signed)
 [facility] Provider Note    Event Date/Time   First MD Initiated Contact with Patient 08/29/23 [phone]     (approximate)   History   Abdominal Pain and Emesis   HPI  Brenda Mccarthy is a 48 y.o. female past medical history significant for diabetes, CAD, gastroparesis, presents to the emergency department with nausea and vomiting.  States that on Friday started having abdominal pain and nausea that worsened yesterday.  Diffuse upper abdominal pain.  Associate with nausea and vomiting.  States that it feels similar to prior episodes of gastroparesis and her glucose has been increasing.  Uses insulin to control her glucose.  Denies any diarrhea.  No fever or chills.  No dysuria, urinary urgency or frequency.  Complaining of some burning in her chest from the vomiting.  Denies any blood in her stool.  No history of gastritis/PUD.     Physical Exam   Triage Vital Signs: ED Triage Vitals  Encounter Vitals Group     BP 08/29/23 0743 (!) 163/104     Systolic BP Percentile --      Diastolic BP Percentile --      Pulse Rate 08/29/23 0743 (!) 101     Resp 08/29/23 0743 19     Temp 08/29/23 0743 98 F (36.7 C)     Temp src --      SpO2 08/29/23 0743 100 %     Weight 08/29/23 0742 192 lb (87.1 kg)     Height 08/29/23 0742 5\' 9"  (1.753 m)     Head Circumference --      Peak Flow --      Pain Score 08/29/23 0742 10     Pain Loc --      Pain Education --      Exclude from Growth Chart --     Most recent vital signs: Vitals:   08/29/23 1000 08/29/23 1030  BP: (!) 161/87 105/67  Pulse: 86 85  Resp: 17 (!) 21  Temp:    SpO2: 100% 100%    Physical Exam Constitutional:      Appearance: She is well-developed.  HENT:     Head: Atraumatic.  Eyes:     Conjunctiva/sclera: Conjunctivae normal.  Cardiovascular:     Rate and Rhythm: Regular rhythm. Tachycardia present.  Pulmonary:     Effort: No respiratory distress.     Breath sounds: No  wheezing.  Abdominal:     General: There is no distension.     Tenderness: There is generalized abdominal tenderness. Negative signs include Murphy's sign.  Musculoskeletal:        General: Normal range of motion.     Cervical back: Normal range of motion.  Skin:    General: Skin is warm.     Capillary Refill: Capillary refill takes less than 2 seconds.  Neurological:     General: No focal deficit present.     Mental Status: She is alert. Mental status is at baseline.     IMPRESSION / MDM / ASSESSMENT AND PLAN / ED COURSE  I reviewed the triage vital signs and the nursing notes.  Differential diagnosis including hyperglycemia, DKA, gastroparesis, symptomatic cholelithiasis, acute cholecystitis, electrolyte abnormality, dehydration  EKG  I, Corena Herter, the attending physician, personally viewed and interpreted this ECG.  EKG with normal sinus rhythm.  Normal intervals.  T waves inverted to the inferior leads.  No significant T waves that have been inverted on prior EKG. normal intervals  EKG with no ST changes on repeat.  Continues to have T waves that are inverted to the inferior leads.  No tachycardic or bradycardic dysrhythmias while on cardiac telemetry.  LABS (all labs ordered are listed, but only abnormal results are displayed) Labs interpreted as -    Labs Reviewed  COMPREHENSIVE METABOLIC PANEL - Abnormal; Notable for the following components:      Result Value   Sodium 130 (*)    Potassium 3.3 (*)    Chloride 91 (*)    Glucose, Bld 378 (*)    BUN 38 (*)    Creatinine, Ser 1.79 (*)    Total Protein 8.9 (*)    ALT 58 (*)    Alkaline Phosphatase 222 (*)    GFR, Estimated 34 (*)    Anion gap 16 (*)    All other components within normal limits  CBC - Abnormal; Notable for the following components:   WBC 14.9 (*)    RBC 5.32 (*)    All other components within normal limits  BETA-HYDROXYBUTYRIC ACID - Abnormal; Notable for the following components:    Beta-Hydroxybutyric Acid 1.62 (*)    All other components within normal limits  BLOOD GAS, VENOUS - Abnormal; Notable for the following components:   pH, Ven 7.48 (*)    pCO2, Ven 33 (*)    All other components within normal limits  MAGNESIUM - Abnormal; Notable for the following components:   Magnesium 1.5 (*)    All other components within normal limits  CBG MONITORING, ED - Abnormal; Notable for the following components:   Glucose-Capillary 369 (*)    All other components within normal limits  TROPONIN I (HIGH SENSITIVITY) - Abnormal; Notable for the following components:   Troponin I (High Sensitivity) 704 (*)    All other components within normal limits  LIPASE, BLOOD  APTT  PROTIME-INR  URINALYSIS, ROUTINE W REFLEX MICROSCOPIC  HEPARIN LEVEL (UNFRACTIONATED)  HEMOGLOBIN A1C  CK  POC URINE PREG, ED  TROPONIN I (HIGH SENSITIVITY)     MDM    Patient with nausea and vomiting.  Complaining of diffuse abdominal pain.  Patient was given IV fluids, morphine.  EKG with no prolonged QT interval.  Given a dose of IV Reglan.  Hyperglycemia with glucose in the 300s.  Anion gap mildly elevated at 16.  Potassium 3.3.  CO2 is normal at 23.  Pseudohyponatremia.  Mild acute kidney injury from her baseline up to 1.8 from 1.1-1.4.  Does have mildly elevated LFTs.  No significant right upper quadrant abdominal tenderness to palpation.  Have low suspicion for acute cholecystitis.  Adding on troponin, vbg, beta-hydroxybutyrate additional lab work to further evaluate.  Started on IV potassium.  Troponin significantly elevated in the 700s.  Patient was given aspirin and started on heparin bolus and infusion.  Given IV potassium and magnesium given hypomagnesium and hypo-K which is likely adding to her prolonged QTc.  Repeat EKG without findings of ST elevation.  On chart review had a CT coronary within the past 2 months that was normal.  Patient also had a troponin that was elevated with prior  episode of gastroparesis/hyperglycemia on review of outside hospital.  On reevaluation continues to have nausea and vomiting.  Given IV Ativan for an antiemetic given prolonged QT.  Repeat abdominal exam continues to have some mild diffuse abdominal tenderness with no focal findings.  No rebound or guarding.  Patient states she no longer has any chest pain.  Consulted hospitalist for  admission for hyperglycemia and elevated troponin   PROCEDURES:  Critical Care performed: yes  .Critical Care  Performed by: Corena Herter, MD Authorized by: Corena Herter, MD   Critical care provider statement:    Critical care time (minutes):  30   Critical care time was exclusive of:  Separately billable procedures and treating other patients   Critical care was necessary to treat or prevent imminent or life-threatening deterioration of the following conditions:  Cardiac failure and endocrine crisis   Critical care was time spent personally by me on the following activities:  Development of treatment plan with patient or surrogate, discussions with consultants, evaluation of patient's response to treatment, examination of patient, ordering and review of laboratory studies, ordering and review of radiographic studies, ordering and performing treatments and interventions, pulse oximetry, re-evaluation of patient's condition and review of old charts   Care discussed with: admitting provider     Patient's presentation is most consistent with acute presentation with potential threat to life or bodily function.   MEDICATIONS ORDERED IN ED: Medications  morphine (PF) 4 MG/ML injection 8 mg (0 mg Intravenous Hold 08/29/23 1012)  heparin ADULT infusion 100 units/mL (25000 units/227mL) (1,100 Units/hr Intravenous New Bag/Given 08/29/23 1002)  potassium chloride SA (KLOR-CON M) CR tablet 40 mEq (has no administration in time range)  prochlorperazine (COMPAZINE) injection 10 mg (has no administration in time range)   metoCLOPramide (REGLAN) injection 10 mg (has no administration in time range)  atorvastatin (LIPITOR) tablet 40 mg (has no administration in time range)  FLUoxetine (PROZAC) capsule 40 mg (has no administration in time range)  traZODone (DESYREL) tablet 50 mg (has no administration in time range)  insulin glargine (LANTUS) injection 12 Units (has no administration in time range)  famotidine (PEPCID) tablet 10 mg (has no administration in time range)  pantoprazole (PROTONIX) EC tablet 40 mg (has no administration in time range)  sucralfate (CARAFATE) tablet 1 g (has no administration in time range)  loratadine (CLARITIN) tablet 10 mg (has no administration in time range)  diphenhydrAMINE (BENADRYL) capsule 25 mg (has no administration in time range)  insulin aspart (novoLOG) injection 0-15 Units (has no administration in time range)  sodium chloride 0.9 % bolus 1,000 mL (0 mLs Intravenous Stopped 08/29/23 1039)  morphine (PF) 4 MG/ML injection 4 mg (4 mg Intravenous Given 08/29/23 0828)  metoCLOPramide (REGLAN) injection 10 mg (10 mg Intravenous Given 08/29/23 0828)  potassium chloride 10 mEq in 100 mL IVPB (0 mEq Intravenous Stopped 08/29/23 0939)  magnesium sulfate IVPB 1 g 100 mL (0 g Intravenous Stopped 08/29/23 1040)  aspirin chewable tablet 324 mg (324 mg Oral Given 08/29/23 0940)  heparin bolus via infusion 4,000 Units (4,000 Units Intravenous Bolus from Bag 08/29/23 1002)  LORazepam (ATIVAN) injection 1 mg (1 mg Intravenous Given 08/29/23 0959)    FINAL CLINICAL IMPRESSION(S) / ED DIAGNOSES   Final diagnoses:  Nausea and vomiting, unspecified vomiting type  Hyperglycemia  Prolonged Q-T interval on ECG     Rx / DC Orders   ED Discharge Orders     None        Note:  This document was prepared using Dragon voice recognition software and may include unintentional dictation errors.   Corena Herter, MD 08/29/23 1042

## 2023-08-30 ENCOUNTER — Encounter: Payer: Self-pay | Admitting: Internal Medicine

## 2023-08-30 DIAGNOSIS — N1831 Chronic kidney disease, stage 3a: Secondary | ICD-10-CM

## 2023-08-30 DIAGNOSIS — K3184 Gastroparesis: Secondary | ICD-10-CM | POA: Diagnosis present

## 2023-08-30 DIAGNOSIS — J449 Chronic obstructive pulmonary disease, unspecified: Secondary | ICD-10-CM | POA: Diagnosis present

## 2023-08-30 DIAGNOSIS — E876 Hypokalemia: Secondary | ICD-10-CM | POA: Diagnosis present

## 2023-08-30 DIAGNOSIS — R112 Nausea with vomiting, unspecified: Secondary | ICD-10-CM | POA: Diagnosis present

## 2023-08-30 DIAGNOSIS — Z794 Long term (current) use of insulin: Secondary | ICD-10-CM

## 2023-08-30 DIAGNOSIS — R9431 Abnormal electrocardiogram [ECG] [EKG]: Secondary | ICD-10-CM | POA: Diagnosis present

## 2023-08-30 DIAGNOSIS — E874 Mixed disorder of acid-base balance: Secondary | ICD-10-CM | POA: Diagnosis present

## 2023-08-30 DIAGNOSIS — Z8249 Family history of ischemic heart disease and other diseases of the circulatory system: Secondary | ICD-10-CM | POA: Diagnosis not present

## 2023-08-30 DIAGNOSIS — Z87891 Personal history of nicotine dependence: Secondary | ICD-10-CM | POA: Diagnosis not present

## 2023-08-30 DIAGNOSIS — E1165 Type 2 diabetes mellitus with hyperglycemia: Secondary | ICD-10-CM

## 2023-08-30 DIAGNOSIS — R7989 Other specified abnormal findings of blood chemistry: Secondary | ICD-10-CM

## 2023-08-30 DIAGNOSIS — I2510 Atherosclerotic heart disease of native coronary artery without angina pectoris: Secondary | ICD-10-CM | POA: Diagnosis present

## 2023-08-30 DIAGNOSIS — Z79899 Other long term (current) drug therapy: Secondary | ICD-10-CM | POA: Diagnosis not present

## 2023-08-30 DIAGNOSIS — E1143 Type 2 diabetes mellitus with diabetic autonomic (poly)neuropathy: Secondary | ICD-10-CM | POA: Diagnosis present

## 2023-08-30 DIAGNOSIS — E785 Hyperlipidemia, unspecified: Secondary | ICD-10-CM | POA: Diagnosis present

## 2023-08-30 DIAGNOSIS — N179 Acute kidney failure, unspecified: Secondary | ICD-10-CM | POA: Diagnosis present

## 2023-08-30 DIAGNOSIS — Z833 Family history of diabetes mellitus: Secondary | ICD-10-CM | POA: Diagnosis not present

## 2023-08-30 DIAGNOSIS — E1122 Type 2 diabetes mellitus with diabetic chronic kidney disease: Secondary | ICD-10-CM | POA: Diagnosis present

## 2023-08-30 LAB — CBG MONITORING, ED
Glucose-Capillary: 219 mg/dL — ABNORMAL HIGH (ref 70–99)
Glucose-Capillary: 235 mg/dL — ABNORMAL HIGH (ref 70–99)

## 2023-08-30 LAB — URINALYSIS, ROUTINE W REFLEX MICROSCOPIC
Bilirubin Urine: NEGATIVE
Glucose, UA: 50 mg/dL — AB
Hgb urine dipstick: NEGATIVE
Ketones, ur: NEGATIVE mg/dL
Nitrite: NEGATIVE
Protein, ur: 30 mg/dL — AB
Specific Gravity, Urine: 1.013 (ref 1.005–1.030)
pH: 6 (ref 5.0–8.0)

## 2023-08-30 LAB — BASIC METABOLIC PANEL
Anion gap: 9 (ref 5–15)
BUN: 37 mg/dL — ABNORMAL HIGH (ref 6–20)
CO2: 24 mmol/L (ref 22–32)
Calcium: 8.4 mg/dL — ABNORMAL LOW (ref 8.9–10.3)
Chloride: 105 mmol/L (ref 98–111)
Creatinine, Ser: 1.53 mg/dL — ABNORMAL HIGH (ref 0.44–1.00)
GFR, Estimated: 41 mL/min — ABNORMAL LOW (ref >60)
Glucose, Bld: 158 mg/dL — ABNORMAL HIGH (ref 70–99)
Potassium: 3.9 mmol/L (ref 3.5–5.1)
Sodium: 136 mmol/L (ref 135–145)

## 2023-08-30 LAB — CBC
HCT: 37.4 % (ref 36.0–46.0)
Hemoglobin: 12.3 g/dL (ref 12.0–15.0)
MCH: 28.8 pg (ref 26.0–34.0)
MCHC: 32.9 g/dL (ref 30.0–36.0)
MCV: 87.6 fL (ref 80.0–100.0)
Platelets: 284 10*3/uL (ref 150–400)
RBC: 4.27 MIL/uL (ref 3.87–5.11)
RDW: 14.3 % (ref 11.5–15.5)
WBC: 12.6 10*3/uL — ABNORMAL HIGH (ref 4.0–10.5)
nRBC: 0 % (ref 0.0–0.2)

## 2023-08-30 LAB — HEPARIN LEVEL (UNFRACTIONATED)
Heparin Unfractionated: 0.3 [IU]/mL (ref 0.30–0.70)
Heparin Unfractionated: 0.25 [IU]/mL — ABNORMAL LOW (ref 0.30–0.70)

## 2023-08-30 LAB — TROPONIN I (HIGH SENSITIVITY)
Troponin I (High Sensitivity): 333 ng/L
Troponin I (High Sensitivity): 237 ng/L (ref <18)

## 2023-08-30 LAB — GLUCOSE, CAPILLARY
Glucose-Capillary: 140 mg/dL — ABNORMAL HIGH (ref 70–99)
Glucose-Capillary: 140 mg/dL — ABNORMAL HIGH (ref 70–99)
Glucose-Capillary: 181 mg/dL — ABNORMAL HIGH (ref 70–99)

## 2023-08-30 MED ORDER — ENOXAPARIN SODIUM 40 MG/0.4ML IJ SOSY
40.0000 mg | PREFILLED_SYRINGE | INTRAMUSCULAR | Status: DC
  Administered 2023-08-30: 40 mg via SUBCUTANEOUS
  Filled 2023-08-30: qty 0.4

## 2023-08-30 MED ORDER — SODIUM CHLORIDE 0.9 % IV BOLUS
500.0000 mL | Freq: Once | INTRAVENOUS | Status: AC
  Administered 2023-08-30: 500 mL via INTRAVENOUS

## 2023-08-30 MED ORDER — INSULIN ASPART 100 UNIT/ML IJ SOLN
0.0000 [IU] | INTRAMUSCULAR | Status: DC
  Administered 2023-08-30: 3 [IU] via SUBCUTANEOUS
  Administered 2023-08-30 – 2023-08-31 (×2): 2 [IU] via SUBCUTANEOUS
  Filled 2023-08-30 (×3): qty 1

## 2023-08-30 MED ORDER — HEPARIN BOLUS VIA INFUSION
1300.0000 [IU] | Freq: Once | INTRAVENOUS | Status: AC
  Administered 2023-08-30: 1300 [IU] via INTRAVENOUS
  Filled 2023-08-30: qty 1300

## 2023-08-30 NOTE — Progress Notes (Signed)
 Progress Note   Patient: Brenda Mccarthy WUJ:[id number] DOB: August 15, 1975 DOA: 08/29/2023     0 DOS: the patient was seen and examined on 08/30/2023   Brief hospital course: Brenda Mccarthy is a 48 y.o. female with medical history significant of IDDM, gastroparesis, diabetic neuropathy, nonobstructive CAD, CKD stage IIIa presented with persistent nausea with vomiting.   Assessment and Plan: Intractable nausea and vomiting Gastroparesis flare up. Still feels nauseous, not able to tolerate clears. Continue IV Reglan therapy. Continue Zofran, Compazine Outpatient [facility] gastroparesis clinic follow up.  Elevated Troponin Non obstructive CAD. Troponin trending down. No chest pain. Echo reviewed unremarkable. Will stop Heparin drip.  Hypokalemia- Due to GI losses. Continue to replete as needed.  CKD stage 3 a- Kidney function stable. Monitor daily renal function. Avoid nephrotoxic drugs.  Type 2 diabetes mellitus- A1c 8.6. Blood sugars in 200. She will be continued on long acting insulin, sliding scale as per protocol.       Out of bed to chair. Incentive spirometry. Nursing supportive care. Fall, aspiration precautions. DVT prophylaxis   Code Status: Full Code  Subjective: Patient is seen and examined today morning.  She is complaining of nausea, unable to tolerate liquid diet.  Has diffuse abdominal discomfort.  Feels weak, did not get out of bed.  Physical Exam: Vitals:   08/30/23 0515 08/30/23 0700 08/30/23 0707 08/30/23 0934  BP: 127/86 (!) 156/82  (!) 197/98  Pulse: 86 95  100  Resp: 17 13  15   Temp: 97.8 F (36.6 C)   97.7 F (36.5 C)  TempSrc: Oral   Oral  SpO2: 100% 98% 98% 100%  Weight:      Height:        General - Elderly Caucasian female, in distress due to nausea, pain HEENT - PERRLA, EOMI, atraumatic head, non tender sinuses. Lung - Clear, no rales, rhonchi, wheezes. Heart - S1, S2 heard, no murmurs, rubs, trace pedal  edema. Abdomen - Soft, diffuse tenderness, no guarding, bowel sounds good Neuro - Alert, awake and oriented x 3, non focal exam. Skin - Warm and dry.  Data Reviewed:      Latest Ref Rng & Units 08/30/2023    2:08 AM 08/29/2023    7:56 AM 03/31/2023    3:37 AM  CBC  WBC 4.0 - 10.5 K/uL 12.6  14.9  8.0   Hemoglobin 12.0 - 15.0 g/dL 95.6  21.3  08.6   Hematocrit 36.0 - 46.0 % 37.4  45.0  33.5   Platelets 150 - 400 K/uL 284  366  307       Latest Ref Rng & Units 08/30/2023    2:08 AM 08/29/2023    7:56 AM 03/31/2023    3:37 AM  BMP  Glucose 70 - 99 mg/dL 578  469  74   BUN 6 - 20 mg/dL 37  38  31   Creatinine 0.44 - 1.00 mg/dL 6.29  5.28  4.13   Sodium 135 - 145 mmol/L 136  130  138   Potassium 3.5 - 5.1 mmol/L 3.9  3.3  3.9   Chloride 98 - 111 mmol/L 105  91  107   CO2 22 - 32 mmol/L 24  23  22    Calcium 8.9 - 10.3 mg/dL 8.4  9.4  8.2    ECHOCARDIOGRAM COMPLETE Result Date: 08/29/2023    ECHOCARDIOGRAM REPORT   Patient Name:   Brenda Mccarthy Date of Exam: 08/29/2023 Medical Rec #:  [id number]  Height:       69.0 in Accession #:    [id number]                 Weight:       192.0 lb Date of Birth:  03-19-1976                   BSA:          2.030 m Patient Age:    47 years                   BP:           105/67 mmHg Patient Gender: F                          HR:           86 bpm. Exam Location:  [facility] Procedure: 2D Echo (Both Spectral and Color Flow Doppler were utilized during            procedure). Indications:     NSTEMI I21.4  History:         Patient has prior history of Echocardiogram examinations.  Sonographer:     Elwin Sleight RDCS Referring Phys:  [id number] Emeline General Diagnosing Phys: Julien Nordmann MD  Sonographer Comments: Image acquisition challenging due to respiratory motion. IMPRESSIONS  1. Left ventricular ejection fraction, by estimation, is 50 to 55%. The left ventricle has low normal function. The left ventricle has no regional wall motion abnormalities.  Left ventricular diastolic parameters were normal.  2. Right ventricular systolic function is normal. The right ventricular size is normal.  3. The mitral valve is normal in structure. Mild mitral valve regurgitation. No evidence of mitral stenosis.  4. The aortic valve is normal in structure. Aortic valve regurgitation is not visualized. Aortic valve sclerosis is present, with no evidence of aortic valve stenosis.  5. The inferior vena cava is normal in size with greater than 50% respiratory variability, suggesting right atrial pressure of 3 mmHg. FINDINGS  Left Ventricle: Left ventricular ejection fraction, by estimation, is 50 to 55%. The left ventricle has low normal function. The left ventricle has no regional wall motion abnormalities. Strain was performed and the global longitudinal strain is indeterminate. The left ventricular internal cavity size was normal in size. There is borderline left ventricular hypertrophy. Left ventricular diastolic parameters were normal. Right Ventricle: The right ventricular size is normal. No increase in right ventricular wall thickness. Right ventricular systolic function is normal. Left Atrium: Left atrial size was normal in size. Right Atrium: Right atrial size was normal in size. Pericardium: There is no evidence of pericardial effusion. Mitral Valve: The mitral valve is normal in structure. Mild mitral valve regurgitation. No evidence of mitral valve stenosis. Tricuspid Valve: The tricuspid valve is normal in structure. Tricuspid valve regurgitation is not demonstrated. No evidence of tricuspid stenosis. Aortic Valve: The aortic valve is normal in structure. Aortic valve regurgitation is not visualized. Aortic valve sclerosis is present, with no evidence of aortic valve stenosis. Aortic valve peak gradient measures 6.1 mmHg. Pulmonic Valve: The pulmonic valve was normal in structure. Pulmonic valve regurgitation is not visualized. No evidence of pulmonic stenosis. Aorta:  The aortic root is normal in size and structure. Venous: The inferior vena cava is normal in size with greater than 50% respiratory variability, suggesting right atrial pressure of 3 mmHg. IAS/Shunts: No atrial level shunt detected by color flow Doppler.  Additional Comments: 3D was performed not requiring image post processing on an independent workstation and was indeterminate.  LEFT VENTRICLE PLAX 2D LVIDd:         4.30 cm   Diastology LVIDs:         3.00 cm   LV e' medial:  7.18 cm/s LV PW:         1.10 cm   LV e' lateral: 10.60 cm/s LV IVS:        1.10 cm LVOT diam:     2.00 cm LV SV:         50 LV SV Index:   25 LVOT Area:     3.14 cm  RIGHT VENTRICLE RV Basal diam:  2.40 cm RV S prime:     13.80 cm/s TAPSE (M-mode): 2.0 cm LEFT ATRIUM           Index        RIGHT ATRIUM           Index LA diam:      3.60 cm 1.77 cm/m   RA Area:     12.50 cm LA Vol (A2C): 48.2 ml 23.74 ml/m  RA Volume:   29.10 ml  14.33 ml/m LA Vol (A4C): 38.9 ml 19.16 ml/m  AORTIC VALVE                 PULMONIC VALVE AV Area (Vmax): 2.65 cm     PV Vmax:        1.11 m/s AV Vmax:        123.50 cm/s  PV Peak grad:   4.9 mmHg AV Peak Grad:   6.1 mmHg     RVOT Peak grad: 3 mmHg LVOT Vmax:      104.00 cm/s LVOT Vmean:     62.900 cm/s LVOT VTI:       0.159 m  AORTA Ao Root diam: 2.90 cm Ao Asc diam:  2.60 cm  SHUNTS Systemic VTI:  0.16 m Systemic Diam: 2.00 cm Julien Nordmann MD Electronically signed by Julien Nordmann MD Signature Date/Time: 08/29/2023/3:50:49 PM    Final     Family Communication: Discussed with patient, she understand and agree. All questions answered.  Disposition: Status is: changed to Inpatient Remains inpatient appropriate because: symptomatic even with IV antiemetics.   Planned Discharge Destination: Home     Time spent: 38 minutes  Author: Marcelino Duster, MD 08/30/2023 11:55 AM Secure chat 7am to 7pm For on call review [email or web address].

## 2023-08-30 NOTE — Consult Note (Signed)
 PHARMACY - ANTICOAGULATION CONSULT NOTE  Pharmacy Consult for Heparin Infusion Indication: chest pain/ACS  Allergies  Allergen Reactions   Bee Venom Anaphylaxis   Metformin Diarrhea and Nausea And Vomiting   Other     Throat swelling with bleach    Patient Measurements: Height: 5\' 9"  (175.3 cm) Weight: 87.1 kg (192 lb) IBW/kg (Calculated) : 66.2 Heparin Dosing Weight: 84.1  Vital Signs: Temp: 97.7 F (36.5 C) (03/10 0217) Temp Source: Oral (03/10 0217) BP: 115/72 (03/10 0217) Pulse Rate: 83 (03/10 0217)  Labs: Recent Labs    08/29/23 0756 08/29/23 1040 08/29/23 2043 08/30/23 0208  HGB 14.9  --   --  12.3  HCT 45.0  --   --  37.4  PLT 366  --   --  284  APTT 26  --   --   --   LABPROT 13.6  --   --   --   INR 1.0  --   --   --   HEPARINUNFRC  --   --   --  0.30  CREATININE 1.79*  --   --   --   CKTOTAL  --  136  --   --   TROPONINIHS 704* 794* 402*  --     Estimated Creatinine Clearance: 44.8 mL/min (A) (by C-G formula based on SCr of 1.79 mg/dL (H)).   Medical History: Past Medical History:  Diagnosis Date   Diabetes mellitus without complication (HCC)    Diabetic neuropathy (HCC)    Gastroparesis    Hydradenitis    Migraine    Assessment: Brenda Mccarthy is a 48 y.o. female presenting with persistent nausea and vomiting. PMH significant for T2DM, HLD, depression, CAD. Patient was not on [facility] PTA per chart review. Patient was started on heparin infusion the morning of 3/9 which was subsequently discontinued after ~5 hours. Pharmacy has been consulted to re-initiate and manage heparin infusion.   Baseline Labs: aPTT 26, PT 13.6, INR 1.0, Hgb 14.9, Hct 45.0, Plt 366   Goal of Therapy:  Heparin level 0.3-0.7 units/ml Monitor platelets by anticoagulation protocol: Yes  3/10 0208 HL 0.3, therapeutic x 1   Plan:  Continue heparin infusion at 1100 units/hr Recheck HL in 6 hours to confirm  Continue to monitor H&H and platelets daily while on  heparin infusion   Otelia Sergeant, PharmD, [facility] 08/30/2023 2:54 AM

## 2023-08-30 NOTE — Consult Note (Signed)
 PHARMACY - ANTICOAGULATION CONSULT NOTE  Pharmacy Consult for Heparin Infusion Indication: chest pain/ACS  Allergies  Allergen Reactions   Bee Venom Anaphylaxis   Metformin Diarrhea and Nausea And Vomiting   Other     Throat swelling with bleach    Patient Measurements: Height: 5\' 9"  (175.3 cm) Weight: 87.1 kg (192 lb) IBW/kg (Calculated) : 66.2 Heparin Dosing Weight: 84.1  Vital Signs: Temp: 97.8 F (36.6 C) (03/10 0515) Temp Source: Oral (03/10 0515) BP: 156/82 (03/10 0700) Pulse Rate: 95 (03/10 0700)  Labs: Recent Labs    08/29/23 0756 08/29/23 1040 08/29/23 2043 08/30/23 0208 08/30/23 0507 08/30/23 0754  HGB 14.9  --   --  12.3  --   --   HCT 45.0  --   --  37.4  --   --   PLT 366  --   --  284  --   --   APTT 26  --   --   --   --   --   LABPROT 13.6  --   --   --   --   --   INR 1.0  --   --   --   --   --   HEPARINUNFRC  --   --   --  0.30  --  0.25*  CREATININE 1.79*  --   --  1.53*  --   --   CKTOTAL  --  136  --   --   --   --   TROPONINIHS 704* 794* 402* 333* 237*  --     Estimated Creatinine Clearance: 52.4 mL/min (A) (by C-G formula based on SCr of 1.53 mg/dL (H)).   Medical History: Past Medical History:  Diagnosis Date   Diabetes mellitus without complication (HCC)    Diabetic neuropathy (HCC)    Gastroparesis    Hydradenitis    Migraine    Assessment: Brenda Mccarthy is a 48 y.o. female presenting with persistent nausea and vomiting. PMH significant for T2DM, HLD, depression, CAD. Patient was not on [facility] PTA per chart review. Patient was started on heparin infusion the morning of 3/9 which was subsequently discontinued after ~5 hours. Pharmacy has been consulted to re-initiate and manage heparin infusion.   Baseline Labs: aPTT 26, PT 13.6, INR 1.0, Hgb 14.9, Hct 45.0, Plt 366   Goal of Therapy:  Heparin level 0.3-0.7 units/ml Monitor platelets by anticoagulation protocol: Yes  3/10 0208 HL 0.3, therapeutic x 1 3/10 0754 HL  0.25 SUBtherapeutic   Plan:  3/10 0754  HL 0.25    SUBtherapeutic Will order bolus of 1300 units x 1 and increase heparin infusion to 1300 units/hr Recheck HL in 6 hours after rate change Continue to monitor H&H and platelets daily while on heparin infusion   Bari Mantis PharmD Clinical Pharmacist 08/30/2023'

## 2023-08-30 NOTE — ED Notes (Signed)
 Patient ambulated to the restroom at this time.

## 2023-08-30 NOTE — ED Notes (Addendum)
 Patient ambulated to the restroom at this time with this RN as a standby assist.

## 2023-08-31 DIAGNOSIS — E1122 Type 2 diabetes mellitus with diabetic chronic kidney disease: Secondary | ICD-10-CM | POA: Diagnosis not present

## 2023-08-31 DIAGNOSIS — K3184 Gastroparesis: Secondary | ICD-10-CM | POA: Diagnosis not present

## 2023-08-31 DIAGNOSIS — R112 Nausea with vomiting, unspecified: Secondary | ICD-10-CM | POA: Diagnosis not present

## 2023-08-31 DIAGNOSIS — R7989 Other specified abnormal findings of blood chemistry: Secondary | ICD-10-CM | POA: Diagnosis not present

## 2023-08-31 LAB — BASIC METABOLIC PANEL
Anion gap: 7 (ref 5–15)
BUN: 22 mg/dL — ABNORMAL HIGH (ref 6–20)
CO2: 23 mmol/L (ref 22–32)
Calcium: 8.8 mg/dL — ABNORMAL LOW (ref 8.9–10.3)
Chloride: 106 mmol/L (ref 98–111)
Creatinine, Ser: 1 mg/dL (ref 0.44–1.00)
GFR, Estimated: >60 mL/min (ref >60)
Glucose, Bld: 106 mg/dL — ABNORMAL HIGH (ref 70–99)
Potassium: 3.7 mmol/L (ref 3.5–5.1)
Sodium: 136 mmol/L (ref 135–145)

## 2023-08-31 LAB — CBC
HCT: 34.7 % — ABNORMAL LOW (ref 36.0–46.0)
Hemoglobin: 11.4 g/dL — ABNORMAL LOW (ref 12.0–15.0)
MCH: 28.3 pg (ref 26.0–34.0)
MCHC: 32.9 g/dL (ref 30.0–36.0)
MCV: 86.1 fL (ref 80.0–100.0)
Platelets: 253 10*3/uL (ref 150–400)
RBC: 4.03 MIL/uL (ref 3.87–5.11)
RDW: 13.9 % (ref 11.5–15.5)
WBC: 10 10*3/uL (ref 4.0–10.5)
nRBC: 0 % (ref 0.0–0.2)

## 2023-08-31 LAB — GLUCOSE, CAPILLARY
Glucose-Capillary: 115 mg/dL — ABNORMAL HIGH (ref 70–99)
Glucose-Capillary: 133 mg/dL — ABNORMAL HIGH (ref 70–99)

## 2023-08-31 MED ORDER — PROCHLORPERAZINE MALEATE 10 MG PO TABS
10.0000 mg | ORAL_TABLET | Freq: Once | ORAL | Status: AC
  Administered 2023-08-31: 10 mg via ORAL
  Filled 2023-08-31: qty 1

## 2023-08-31 NOTE — Discharge Summary (Signed)
 Physician Discharge Summary   Patient: Brenda Mccarthy MRN: [id number] DOB: 06/03/1976  Admit date:     08/29/2023  Discharge date: 08/31/23  Discharge Physician: Marcelino Duster   PCP: Cleon Dew, FNP   Recommendations at discharge:  {Tip this will not be part of the note when signed- Example include specific recommendations for outpatient follow-up, pending tests to follow-up on. (Optional):26781}  ***  Discharge Diagnoses: Principal Problem:   Gastroparesis Active Problems:   Elevated troponin   Intractable nausea and vomiting  Resolved Problems:   * No resolved hospital problems. [facility] Course: No notes on file  Assessment and Plan: No notes have been filed under this hospital service. Service: Hospitalist     {Tip this will not be part of the note when signed Body mass index is 28.35 kg/m. , ,  (Optional):26781}  {(NOTE) Pain control PDMP Statment (Optional):26782} Consultants: *** Procedures performed: ***  Disposition: {Plan; Disposition:26390} Diet recommendation:  Discharge Diet Orders (From admission, onward)     Start     Ordered   08/31/23 0000  Diet - low sodium heart healthy        08/31/23 1013           {Diet_Plan:26776} DISCHARGE MEDICATION: Allergies as of 08/31/2023       Reactions   Bee Venom Anaphylaxis   Metformin Diarrhea, Nausea And Vomiting   Other    Throat swelling with bleach        Medication List     TAKE these medications    acetaminophen 500 MG tablet Commonly known as: TYLENOL Take 500 mg by mouth every 6 (six) hours as needed for moderate pain (pain score 4-6).   ARTHRITIS STRENGTH BC POWDER PO Take 1 Dose by mouth 2 (two) times daily as needed (pain).   atorvastatin 40 MG tablet Commonly known as: LIPITOR Take 40 mg by mouth daily.   cetirizine 10 MG tablet Commonly known as: ZYRTEC Take 10 mg by mouth daily.   cimetidine 200 MG tablet Commonly known as: TAGAMET Take 200  mg by mouth daily as needed (heartburn).   diphenhydrAMINE 25 mg capsule Commonly known as: BENADRYL Take 25 mg by mouth every 6 (six) hours as needed for allergies.   FLUoxetine 20 MG capsule Commonly known as: PROZAC Take 20 mg by mouth daily.   insulin aspart 100 UNIT/ML injection Commonly known as: novoLOG Inject 5 Units into the skin 3 (three) times daily with meals.   insulin glargine 100 UNIT/ML injection Commonly known as: LANTUS Inject 0.12 mLs (12 Units total) into the skin daily. What changed:  how much to take when to take this   metoCLOPramide 10 MG tablet Commonly known as: REGLAN Take 1 tablet (10 mg total) by mouth every 6 (six) hours as needed for nausea. What changed: when to take this   pantoprazole 40 MG tablet Commonly known as: PROTONIX Take 40 mg by mouth 2 (two) times daily.   pyridostigmine 60 MG tablet Commonly known as: MESTINON Take 60 mg by mouth. With evening meal   sucralfate 1 g tablet Commonly known as: CARAFATE Take 1 g by mouth 4 (four) times daily.   traZODone 50 MG tablet Commonly known as: DESYREL Take 50 mg by mouth at bedtime as needed for sleep.        Follow-up Information     Cleon Dew, FNP. Go on 09/15/2023.   Specialty: Family Medicine Why: @ 1:20pm Contact information: [address] [place]  [address] [phone]                Discharge Exam: Ceasar Mons Weights   08/29/23 0742  Weight: 87.1 kg   ***  Condition at discharge: {DC Condition:26389}  The results of significant diagnostics from this hospitalization (including imaging, microbiology, ancillary and laboratory) are listed below for reference.   Imaging Studies: ECHOCARDIOGRAM COMPLETE Result Date: 08/29/2023    ECHOCARDIOGRAM REPORT   Patient Name:   RHYAN WOLTERS [facility] Date of Exam: 08/29/2023 Medical Rec #:  [id number]                  Height:       69.0 in Accession #:    [id number]                 Weight:       192.0 lb  Date of Birth:  12-28-1975                   BSA:          2.030 m Patient Age:    48 years                   BP:           105/67 mmHg Patient Gender: F                          HR:           86 bpm. Exam Location:  [facility] Procedure: 2D Echo (Both Spectral and Color Flow Doppler were utilized during            procedure). Indications:     NSTEMI I21.4  History:         Patient has prior history of Echocardiogram examinations.  Sonographer:     Elwin Sleight RDCS Referring Phys:  [id number] Emeline General Diagnosing Phys: Julien Nordmann MD  Sonographer Comments: Image acquisition challenging due to respiratory motion. IMPRESSIONS  1. Left ventricular ejection fraction, by estimation, is 50 to 55%. The left ventricle has low normal function. The left ventricle has no regional wall motion abnormalities. Left ventricular diastolic parameters were normal.  2. Right ventricular systolic function is normal. The right ventricular size is normal.  3. The mitral valve is normal in structure. Mild mitral valve regurgitation. No evidence of mitral stenosis.  4. The aortic valve is normal in structure. Aortic valve regurgitation is not visualized. Aortic valve sclerosis is present, with no evidence of aortic valve stenosis.  5. The inferior vena cava is normal in size with greater than 50% respiratory variability, suggesting right atrial pressure of 3 mmHg. FINDINGS  Left Ventricle: Left ventricular ejection fraction, by estimation, is 50 to 55%. The left ventricle has low normal function. The left ventricle has no regional wall motion abnormalities. Strain was performed and the global longitudinal strain is indeterminate. The left ventricular internal cavity size was normal in size. There is borderline left ventricular hypertrophy. Left ventricular diastolic parameters were normal. Right Ventricle: The right ventricular size is normal. No increase in right ventricular wall thickness. Right ventricular systolic function is normal. Left  Atrium: Left atrial size was normal in size. Right Atrium: Right atrial size was normal in size. Pericardium: There is no evidence of pericardial effusion. Mitral Valve: The mitral valve is normal in structure. Mild mitral valve regurgitation. No evidence of mitral valve stenosis. Tricuspid Valve: The tricuspid valve is normal in structure.  Tricuspid valve regurgitation is not demonstrated. No evidence of tricuspid stenosis. Aortic Valve: The aortic valve is normal in structure. Aortic valve regurgitation is not visualized. Aortic valve sclerosis is present, with no evidence of aortic valve stenosis. Aortic valve peak gradient measures 6.1 mmHg. Pulmonic Valve: The pulmonic valve was normal in structure. Pulmonic valve regurgitation is not visualized. No evidence of pulmonic stenosis. Aorta: The aortic root is normal in size and structure. Venous: The inferior vena cava is normal in size with greater than 50% respiratory variability, suggesting right atrial pressure of 3 mmHg. IAS/Shunts: No atrial level shunt detected by color flow Doppler. Additional Comments: 3D was performed not requiring image post processing on an independent workstation and was indeterminate.  LEFT VENTRICLE PLAX 2D LVIDd:         4.30 cm   Diastology LVIDs:         3.00 cm   LV e' medial:  7.18 cm/s LV PW:         1.10 cm   LV e' lateral: 10.60 cm/s LV IVS:        1.10 cm LVOT diam:     2.00 cm LV SV:         50 LV SV Index:   25 LVOT Area:     3.14 cm  RIGHT VENTRICLE RV Basal diam:  2.40 cm RV S prime:     13.80 cm/s TAPSE (M-mode): 2.0 cm LEFT ATRIUM           Index        RIGHT ATRIUM           Index LA diam:      3.60 cm 1.77 cm/m   RA Area:     12.50 cm LA Vol (A2C): 48.2 ml 23.74 ml/m  RA Volume:   29.10 ml  14.33 ml/m LA Vol (A4C): 38.9 ml 19.16 ml/m  AORTIC VALVE                 PULMONIC VALVE AV Area (Vmax): 2.65 cm     PV Vmax:        1.11 m/s AV Vmax:        123.50 cm/s  PV Peak grad:   4.9 mmHg AV Peak Grad:   6.1 mmHg      RVOT Peak grad: 3 mmHg LVOT Vmax:      104.00 cm/s LVOT Vmean:     62.900 cm/s LVOT VTI:       0.159 m  AORTA Ao Root diam: 2.90 cm Ao Asc diam:  2.60 cm  SHUNTS Systemic VTI:  0.16 m Systemic Diam: 2.00 cm Julien Nordmann MD Electronically signed by Julien Nordmann MD Signature Date/Time: 08/29/2023/3:50:49 PM    Final     Microbiology: Results for orders placed or performed during the hospital encounter of 03/30/23  C Difficile Quick Screen w PCR reflex     Status: None   Collection Time: 03/31/23  2:00 PM   Specimen: STOOL  Result Value Ref Range Status   C Diff antigen NEGATIVE NEGATIVE Final   C Diff toxin NEGATIVE NEGATIVE Final   C Diff interpretation No C. difficile detected.  Final    Comment: Performed at [facility], [address] [place], [place] [address]  Gastrointestinal Panel by PCR , Stool     Status: None   Collection Time: 03/31/23  2:00 PM   Specimen: Stool  Result Value Ref Range Status   Campylobacter species NOT DETECTED NOT  DETECTED Final   Plesimonas shigelloides NOT DETECTED NOT DETECTED Final   Salmonella species NOT DETECTED NOT DETECTED Final   Yersinia enterocolitica NOT DETECTED NOT DETECTED Final   Vibrio species NOT DETECTED NOT DETECTED Final   Vibrio cholerae NOT DETECTED NOT DETECTED Final   Enteroaggregative E coli (EAEC) NOT DETECTED NOT DETECTED Final   Enteropathogenic E coli (EPEC) NOT DETECTED NOT DETECTED Final   Enterotoxigenic E coli (ETEC) NOT DETECTED NOT DETECTED Final   Shiga like toxin producing E coli (STEC) NOT DETECTED NOT DETECTED Final   Shigella/Enteroinvasive E coli (EIEC) NOT DETECTED NOT DETECTED Final   Cryptosporidium NOT DETECTED NOT DETECTED Final   Cyclospora cayetanensis NOT DETECTED NOT DETECTED Final   Entamoeba histolytica NOT DETECTED NOT DETECTED Final   Giardia lamblia NOT DETECTED NOT DETECTED Final   Adenovirus F40/41 NOT DETECTED NOT DETECTED Final   Astrovirus NOT DETECTED NOT DETECTED Final    Norovirus GI/GII NOT DETECTED NOT DETECTED Final   Rotavirus A NOT DETECTED NOT DETECTED Final   Sapovirus (I, II, IV, and V) NOT DETECTED NOT DETECTED Final    Comment: Performed at [facility], [address] [place], [place] [address]    Labs: CBC: Recent Labs  Lab 08/29/23 0756 08/30/23 0208 08/31/23 0554  WBC 14.9* 12.6* 10.0  HGB 14.9 12.3 11.4*  HCT 45.0 37.4 34.7*  MCV 84.6 87.6 86.1  PLT 366 284 253   Basic Metabolic Panel: Recent Labs  Lab 08/29/23 0756 08/30/23 0208 08/31/23 0554  NA 130* 136 136  K 3.3* 3.9 3.7  CL 91* 105 106  CO2 23 24 23   GLUCOSE 378* 158* 106*  BUN 38* 37* 22*  CREATININE 1.79* 1.53* 1.00  CALCIUM 9.4 8.4* 8.8*  MG 1.5*  --   --    Liver Function Tests: Recent Labs  Lab 08/29/23 0756  AST 37  ALT 58*  ALKPHOS 222*  BILITOT 1.1  PROT 8.9*  ALBUMIN 4.3   CBG: Recent Labs  Lab 08/30/23 1122 08/30/23 1619 08/30/23 2018 08/30/23 2335 08/31/23 0811  GLUCAP 235* 181* 140* 140* 115*    Discharge time spent: {LESS THAN/GREATER UEAV:40981} 30 minutes.  Signed: Marcelino Duster, MD [facility] 08/31/2023

## 2024-01-07 NOTE — Progress Notes (Signed)
 Brenda Mccarthy is seen in consultation at the request of Dr. Lora, Alfonso HERO, FNP for evaluation of diabetic retinopathy.   Assessment:    1. Moderate nonproliferative diabetic retinopathy both eyes  With diabetic macular edema OD which has worsened. Discussed B/R/C/A of anti-VDGF with pt. She elects tx. Also discussed avoiding pregnancy during tx. Pt expresses understanding Diabetes type II for 32 years. Last A1C was 8.5%.   On exam, there is dot/blot heme, exudates  There was diabetic macular edema which was not center involving on OCT OD today.  The importance of tight glucose control and blood pressure control was discussed with the patient and she will work on this with her PCP. - Avastin #1 OD 08/02/23 - Avastin #2 OD 12/08/23 - Avastin #3 OD 01/07/24  2. Mild cataracts both eyes - not visually significant. I recommend cautious observation.   3. Myopic astigmatism with presbyopia -  Did not give glasses rx. Observe      Plan:     RTC 4-5 weeks for possible Avastin OD     EXTENDED OPHTHALMOSCOPY INTERPRETATION MACULA (90D lens): Dot/blot heme, exudates OU   CC: Dr. Lora, Alfonso HERO, FNP     Date of exam: 08/02/23, 12/08/23

## 2024-01-10 ENCOUNTER — Emergency Department

## 2024-01-10 ENCOUNTER — Encounter: Payer: Self-pay | Admitting: Internal Medicine

## 2024-01-10 ENCOUNTER — Inpatient Hospital Stay
Admission: EM | Admit: 2024-01-10 | Discharge: 2024-01-13 | DRG: 074 | Disposition: A | Discharge status: Home / Self Care | Attending: Internal Medicine | Admitting: Internal Medicine

## 2024-01-10 DIAGNOSIS — E1165 Type 2 diabetes mellitus with hyperglycemia: Secondary | ICD-10-CM | POA: Diagnosis present

## 2024-01-10 DIAGNOSIS — R112 Nausea with vomiting, unspecified: Secondary | ICD-10-CM | POA: Diagnosis not present

## 2024-01-10 DIAGNOSIS — N179 Acute kidney failure, unspecified: Secondary | ICD-10-CM | POA: Diagnosis present

## 2024-01-10 DIAGNOSIS — E782 Mixed hyperlipidemia: Secondary | ICD-10-CM | POA: Diagnosis present

## 2024-01-10 DIAGNOSIS — F32A Depression, unspecified: Secondary | ICD-10-CM | POA: Diagnosis present

## 2024-01-10 DIAGNOSIS — F4310 Post-traumatic stress disorder, unspecified: Secondary | ICD-10-CM | POA: Diagnosis present

## 2024-01-10 DIAGNOSIS — K3184 Gastroparesis: Secondary | ICD-10-CM | POA: Diagnosis present

## 2024-01-10 DIAGNOSIS — Z79899 Other long term (current) drug therapy: Secondary | ICD-10-CM | POA: Diagnosis not present

## 2024-01-10 DIAGNOSIS — F1210 Cannabis abuse, uncomplicated: Secondary | ICD-10-CM | POA: Diagnosis present

## 2024-01-10 DIAGNOSIS — K219 Gastro-esophageal reflux disease without esophagitis: Secondary | ICD-10-CM | POA: Diagnosis present

## 2024-01-10 DIAGNOSIS — M797 Fibromyalgia: Secondary | ICD-10-CM | POA: Diagnosis present

## 2024-01-10 DIAGNOSIS — Z794 Long term (current) use of insulin: Secondary | ICD-10-CM | POA: Diagnosis not present

## 2024-01-10 DIAGNOSIS — R296 Repeated falls: Secondary | ICD-10-CM | POA: Diagnosis present

## 2024-01-10 DIAGNOSIS — Z87891 Personal history of nicotine dependence: Secondary | ICD-10-CM

## 2024-01-10 DIAGNOSIS — I9589 Other hypotension: Secondary | ICD-10-CM | POA: Diagnosis present

## 2024-01-10 DIAGNOSIS — E861 Hypovolemia: Principal | ICD-10-CM | POA: Diagnosis present

## 2024-01-10 DIAGNOSIS — E872 Acidosis, unspecified: Secondary | ICD-10-CM | POA: Diagnosis present

## 2024-01-10 DIAGNOSIS — Z8249 Family history of ischemic heart disease and other diseases of the circulatory system: Secondary | ICD-10-CM

## 2024-01-10 DIAGNOSIS — E1143 Type 2 diabetes mellitus with diabetic autonomic (poly)neuropathy: Secondary | ICD-10-CM | POA: Diagnosis present

## 2024-01-10 LAB — COMPREHENSIVE METABOLIC PANEL WITH GFR
ALT: 19 U/L (ref 0–44)
AST: 18 U/L (ref 15–41)
Albumin: 4.6 g/dL (ref 3.5–5.0)
Alkaline Phosphatase: 160 U/L — ABNORMAL HIGH (ref 38–126)
Anion gap: 20 — ABNORMAL HIGH (ref 5–15)
BUN: 71 mg/dL — ABNORMAL HIGH (ref 6–20)
CO2: 19 mmol/L — ABNORMAL LOW (ref 22–32)
Calcium: 10.2 mg/dL (ref 8.9–10.3)
Chloride: 93 mmol/L — ABNORMAL LOW (ref 98–111)
Creatinine, Ser: 4.71 mg/dL — ABNORMAL HIGH (ref 0.44–1.00)
GFR, Estimated: 11 mL/min — ABNORMAL LOW (ref >60)
Glucose, Bld: 304 mg/dL — ABNORMAL HIGH (ref 70–99)
Potassium: 3.7 mmol/L (ref 3.5–5.1)
Sodium: 132 mmol/L — ABNORMAL LOW (ref 135–145)
Total Bilirubin: 1.2 mg/dL (ref 0.0–1.2)
Total Protein: 8.8 g/dL — ABNORMAL HIGH (ref 6.5–8.1)

## 2024-01-10 LAB — CBC WITH DIFFERENTIAL/PLATELET
Abs Immature Granulocytes: 0.19 K/uL — ABNORMAL HIGH (ref 0.00–0.07)
Basophils Absolute: 0.1 K/uL (ref 0.0–0.1)
Basophils Relative: 0 %
Eosinophils Absolute: 0 K/uL (ref 0.0–0.5)
Eosinophils Relative: 0 %
HCT: 48.7 % — ABNORMAL HIGH (ref 36.0–46.0)
Hemoglobin: 15.8 g/dL — ABNORMAL HIGH (ref 12.0–15.0)
Immature Granulocytes: 1 %
Lymphocytes Relative: 13 %
Lymphs Abs: 3.1 K/uL (ref 0.7–4.0)
MCH: 27.9 pg (ref 26.0–34.0)
MCHC: 32.4 g/dL (ref 30.0–36.0)
MCV: 85.9 fL (ref 80.0–100.0)
Monocytes Absolute: 1.5 K/uL — ABNORMAL HIGH (ref 0.1–1.0)
Monocytes Relative: 6 %
Neutro Abs: 18.9 K/uL — ABNORMAL HIGH (ref 1.7–7.7)
Neutrophils Relative %: 80 %
Platelets: 450 K/uL — ABNORMAL HIGH (ref 150–400)
RBC: 5.67 MIL/uL — ABNORMAL HIGH (ref 3.87–5.11)
RDW: 13.6 % (ref 11.5–15.5)
WBC: 23.7 K/uL — ABNORMAL HIGH (ref 4.0–10.5)
nRBC: 0 % (ref 0.0–0.2)

## 2024-01-10 LAB — LACTIC ACID, PLASMA
Lactic Acid, Venous: 2.6 mmol/L (ref 0.5–1.9)
Lactic Acid, Venous: 1.9 mmol/L (ref 0.5–1.9)

## 2024-01-10 LAB — LIPASE, BLOOD: Lipase: 46 U/L (ref 11–51)

## 2024-01-10 LAB — GLUCOSE, CAPILLARY: Glucose-Capillary: 245 mg/dL — ABNORMAL HIGH (ref 70–99)

## 2024-01-10 MED ORDER — LABETALOL HCL 5 MG/ML IV SOLN
20.0000 mg | INTRAVENOUS | Status: DC | PRN
  Administered 2024-01-10: 20 mg via INTRAVENOUS
  Filled 2024-01-10: qty 4

## 2024-01-10 MED ORDER — TRAZODONE HCL 50 MG PO TABS
50.0000 mg | ORAL_TABLET | Freq: Every evening | ORAL | Status: DC | PRN
  Administered 2024-01-12: 50 mg via ORAL
  Filled 2024-01-10: qty 1

## 2024-01-10 MED ORDER — ACETAMINOPHEN 650 MG RE SUPP: 650.0000 mg | Freq: Four times a day (QID) | RECTAL | Status: DC | PRN

## 2024-01-10 MED ORDER — METOCLOPRAMIDE HCL 5 MG/ML IJ SOLN
10.0000 mg | Freq: Four times a day (QID) | INTRAMUSCULAR | Status: AC
  Administered 2024-01-10 – 2024-01-11 (×3): 10 mg via INTRAVENOUS
  Filled 2024-01-10 (×3): qty 2

## 2024-01-10 MED ORDER — PANTOPRAZOLE SODIUM 40 MG IV SOLR
40.0000 mg | Freq: Two times a day (BID) | INTRAVENOUS | Status: AC
  Administered 2024-01-10 – 2024-01-11 (×2): 40 mg via INTRAVENOUS
  Filled 2024-01-10 (×2): qty 10

## 2024-01-10 MED ORDER — INSULIN ASPART 100 UNIT/ML IJ SOLN
0.0000 [IU] | Freq: Every day | INTRAMUSCULAR | Status: DC
  Administered 2024-01-10: 3 [IU] via SUBCUTANEOUS
  Filled 2024-01-10: qty 1

## 2024-01-10 MED ORDER — PYRIDOSTIGMINE BROMIDE 60 MG PO TABS
60.0000 mg | ORAL_TABLET | Freq: Every evening | ORAL | Status: DC
  Administered 2024-01-11 – 2024-01-12 (×2): 60 mg via ORAL
  Filled 2024-01-10 (×4): qty 1

## 2024-01-10 MED ORDER — SODIUM CHLORIDE 0.9 % IV BOLUS
1000.0000 mL | Freq: Once | INTRAVENOUS | Status: AC
  Administered 2024-01-10: 1000 mL via INTRAVENOUS

## 2024-01-10 MED ORDER — ATORVASTATIN CALCIUM 20 MG PO TABS
40.0000 mg | ORAL_TABLET | Freq: Every day | ORAL | Status: DC
  Administered 2024-01-11 – 2024-01-13 (×3): 40 mg via ORAL
  Filled 2024-01-10 (×3): qty 2

## 2024-01-10 MED ORDER — ONDANSETRON HCL 4 MG PO TABS: 4.0000 mg | ORAL_TABLET | Freq: Four times a day (QID) | ORAL | Status: DC | PRN

## 2024-01-10 MED ORDER — DIPHENHYDRAMINE HCL 50 MG/ML IJ SOLN: 12.5000 mg | Freq: Four times a day (QID) | INTRAMUSCULAR | Status: AC | PRN

## 2024-01-10 MED ORDER — SENNOSIDES-DOCUSATE SODIUM 8.6-50 MG PO TABS: 1.0000 | ORAL_TABLET | Freq: Every evening | ORAL | Status: DC | PRN

## 2024-01-10 MED ORDER — INSULIN GLARGINE-YFGN 100 UNIT/ML ~~LOC~~ SOLN
10.0000 [IU] | Freq: Every day | SUBCUTANEOUS | Status: AC
  Administered 2024-01-10: 10 [IU] via SUBCUTANEOUS
  Filled 2024-01-10: qty 0.1

## 2024-01-10 MED ORDER — ONDANSETRON HCL 4 MG/2ML IJ SOLN
4.0000 mg | Freq: Four times a day (QID) | INTRAMUSCULAR | Status: DC | PRN
  Administered 2024-01-10 – 2024-01-13 (×5): 4 mg via INTRAVENOUS
  Filled 2024-01-10 (×5): qty 2

## 2024-01-10 MED ORDER — LACTATED RINGERS IV SOLN: INTRAVENOUS | Status: DC

## 2024-01-10 MED ORDER — INSULIN ASPART 100 UNIT/ML IJ SOLN
0.0000 [IU] | Freq: Three times a day (TID) | INTRAMUSCULAR | Status: DC
  Administered 2024-01-11 (×2): 2 [IU] via SUBCUTANEOUS
  Administered 2024-01-11: 3 [IU] via SUBCUTANEOUS
  Administered 2024-01-12 (×2): 2 [IU] via SUBCUTANEOUS
  Administered 2024-01-12: 1 [IU] via SUBCUTANEOUS
  Administered 2024-01-13: 3 [IU] via SUBCUTANEOUS
  Administered 2024-01-13: 1 [IU] via SUBCUTANEOUS
  Filled 2024-01-10 (×8): qty 1

## 2024-01-10 MED ORDER — LACTATED RINGERS IV BOLUS
1000.0000 mL | Freq: Once | INTRAVENOUS | Status: AC
  Administered 2024-01-10: 1000 mL via INTRAVENOUS

## 2024-01-10 MED ORDER — ACETAMINOPHEN 325 MG PO TABS: 650.0000 mg | ORAL_TABLET | Freq: Four times a day (QID) | ORAL | Status: DC | PRN

## 2024-01-10 MED ORDER — METOCLOPRAMIDE HCL 5 MG/ML IJ SOLN
10.0000 mg | Freq: Once | INTRAMUSCULAR | Status: AC
  Administered 2024-01-10: 10 mg via INTRAVENOUS
  Filled 2024-01-10: qty 2

## 2024-01-10 MED ORDER — HEPARIN SODIUM (PORCINE) 5000 UNIT/ML IJ SOLN
5000.0000 [IU] | Freq: Three times a day (TID) | INTRAMUSCULAR | Status: DC
  Administered 2024-01-10 – 2024-01-13 (×8): 5000 [IU] via SUBCUTANEOUS
  Filled 2024-01-10 (×8): qty 1

## 2024-01-10 MED ORDER — FLUOXETINE HCL 20 MG PO CAPS
20.0000 mg | ORAL_CAPSULE | Freq: Every day | ORAL | Status: DC
  Administered 2024-01-11 – 2024-01-13 (×3): 20 mg via ORAL
  Filled 2024-01-10 (×3): qty 1

## 2024-01-10 NOTE — Assessment & Plan Note (Signed)
 Symptomatic support Reglan  10 mg IV every 6 hours, 3 days which was Ondansetron  4 mg IV every 6 hours as needed for nausea, vomiting

## 2024-01-10 NOTE — Assessment & Plan Note (Signed)
 Protonix  40 mg IV twice daily, 2 doses ordered

## 2024-01-10 NOTE — Assessment & Plan Note (Signed)
Fluoxetine 20 mg daily

## 2024-01-10 NOTE — Hospital Course (Addendum)
 Ms. Brenda Mccarthy is a 48 year old female with history of gastroparesis, hyperlipidemia, depression, insomnia, iddm2, gerd, who presents for chief concern of dizziness, intractable nausea and vomiting, and intermittent lost of consciousness over three days.   Vitals in ED showed T 97.5, rr 18, hr 99, blood pressure 72/44 and improved to 108/66, spO2 is 100% on RA.  Serum sodium 132, K 3.7, chloride 93, bicarb 19, BUN 71, sCr 4.71, eGFR 11, nonfasting blood glucose is 304, wbc 23.7, hgb 15.8, platelet 450.   Anion gap 20, alk phos 160, lactic acid 2.6 and on repeat is 1.9.  ED treatment: Reglan  10 mg IV one-time dose, LR 1 L bolus, x 2, sodium chloride  1 L bolus x 1.

## 2024-01-10 NOTE — Assessment & Plan Note (Signed)
 Prerenal, secondary to intractable nausea and vomiting Status post aggressive fluid hydration Continue with LR infusion at 125 mL/h, 1 day ordered Recheck BMP in a.m.

## 2024-01-10 NOTE — ED Provider Notes (Signed)
 [facility] Provider Note    Event Date/Time   First MD Initiated Contact with Patient 01/10/24 1454     (approximate)   History   Loss of Consciousness, Dizziness, and Hypotension   HPI  Brenda Mccarthy is a 48 y.o. female who presents to the ED for evaluation of Loss of Consciousness, Dizziness, and Hypotension   Reviewed [facility] clinic visit from May.  History of diabetic gastroparesis, chronic nausea vomiting, fibromyalgia, cannabis abuse, PTSD  Patient presents to the ED for evaluation of 4-5 days of recurrent emesis.  She estimates 6 episodes of emesis per day.  Unable to keep anything down.  Reports this is fairly typical for her episodes without novel features such as fevers, stool changes, urinary changes.   She reports multiple falls and episodes of near syncope without full syncope.   Physical Exam   Triage Vital Signs: ED Triage Vitals  Encounter Vitals Group     BP 01/10/24 1433 (!) 72/44     Girls Systolic BP Percentile --      Girls Diastolic BP Percentile --      Boys Systolic BP Percentile --      Boys Diastolic BP Percentile --      Pulse Rate 01/10/24 1433 99     Resp 01/10/24 1433 18     Temp 01/10/24 1434 (!) 97.5 F (36.4 C)     Temp Source 01/10/24 1433 Oral     SpO2 01/10/24 1433 100 %     Weight --      Height --      Head Circumference --      Peak Flow --      Pain Score 01/10/24 1431 10     Pain Loc --      Pain Education --      Exclude from Growth Chart --     Most recent vital signs: Vitals:   01/10/24 1730 01/10/24 1802  BP: (!) 78/50 108/66  Pulse: 86 88  Resp: 17 (!) 26  Temp: 98 F (36.7 C)   SpO2: 97% 100%    General: Awake, no distress.  Looks dry and uncomfortable but pleasant and conversational in full sentences CV:  Good peripheral perfusion.  Resp:  Normal effort.  Abd:  No distention.  Diffuse and mild tenderness without peritoneal features MSK:  No deformity noted.   Neuro:  No focal deficits appreciated. Other:     ED Results / Procedures / Treatments   Labs (all labs ordered are listed, but only abnormal results are displayed) Labs Reviewed  COMPREHENSIVE METABOLIC PANEL WITH GFR - Abnormal; Notable for the following components:      Result Value   Sodium 132 (*)    Chloride 93 (*)    CO2 19 (*)    Glucose, Bld 304 (*)    BUN 71 (*)    Creatinine, Ser 4.71 (*)    Total Protein 8.8 (*)    Alkaline Phosphatase 160 (*)    GFR, Estimated 11 (*)    Anion gap 20 (*)    All other components within normal limits  CBC WITH DIFFERENTIAL/PLATELET - Abnormal; Notable for the following components:   WBC 23.7 (*)    RBC 5.67 (*)    Hemoglobin 15.8 (*)    HCT 48.7 (*)    Platelets 450 (*)    Neutro Abs 18.9 (*)    Monocytes Absolute 1.5 (*)    Abs Immature Granulocytes 0.19 (*)  All other components within normal limits  LACTIC ACID, PLASMA - Abnormal; Notable for the following components:   Lactic Acid, Venous 2.6 (*)    All other components within normal limits  CULTURE, BLOOD (SINGLE)  LACTIC ACID, PLASMA  LIPASE, BLOOD  URINALYSIS, ROUTINE W REFLEX MICROSCOPIC    EKG Sinus tachycardia with rate of 101 bpm.  Normal axis and intervals without present acute ischemia.  RADIOLOGY CXR interpreted by me without evidence of acute cardiopulmonary pathology. CT abdomen/pelvis interpreted by me without evidence of acute pathology. CT no charge L-spine without evidence of fracture, interpreted by me  Official radiology report(s): CT ABDOMEN PELVIS WO CONTRAST Result Date: 01/10/2024 CLINICAL DATA:  Abdominal pain.  Vomiting.  Sepsis. EXAM: CT ABDOMEN AND PELVIS WITHOUT CONTRAST TECHNIQUE: Multidetector CT imaging of the abdomen and pelvis was performed following the standard protocol without IV contrast. RADIATION DOSE REDUCTION: This exam was performed according to the departmental dose-optimization program which includes automated exposure  control, adjustment of the mA and/or kV according to patient size and/or use of iterative reconstruction technique. COMPARISON:  03/30/2023 FINDINGS: Lower chest: No acute findings. Hepatobiliary: No mass visualized on this unenhanced exam. Gallbladder is unremarkable. No evidence of biliary ductal dilatation. Pancreas: No mass or inflammatory process visualized on this unenhanced exam. Spleen:  Within normal limits in size. Adrenals/Urinary tract: No evidence of urolithiasis or hydronephrosis. Unremarkable unopacified urinary bladder. Stomach/Bowel: No evidence of obstruction, inflammatory process, or abnormal fluid collections. Normal appendix visualized. Vascular/Lymphatic: No pathologically enlarged lymph nodes identified. No evidence of abdominal aortic aneurysm. Reproductive:  No mass or other significant abnormality. Other:  None. Musculoskeletal:  No suspicious bone lesions identified. IMPRESSION: No acute findings or other significant abnormality. Electronically Signed   By: Norleen DELENA Kil M.D.   On: 01/10/2024 17:00   CT L-SPINE NO CHARGE Result Date: 01/10/2024 CLINICAL DATA:  Fall and back pain. EXAM: CT LUMBAR SPINE WITHOUT CONTRAST TECHNIQUE: Multidetector CT imaging of the lumbar spine was performed without intravenous contrast administration. Multiplanar CT image reconstructions were also generated. RADIATION DOSE REDUCTION: This exam was performed according to the departmental dose-optimization program which includes automated exposure control, adjustment of the mA and/or kV according to patient size and/or use of iterative reconstruction technique. COMPARISON:  CT and pelvis dated 03/30/2023. FINDINGS: Segmentation: 5 lumbar type vertebrae. Alignment: Normal. Vertebrae: No acute fracture. Paraspinal and other soft tissues: No paraspinal fluid collection or hematoma. Disc levels: Mild degenerative changes at L5-S1. No acute osseous pathology. IMPRESSION: 1. No acute/traumatic lumbar spine  pathology. 2. Mild degenerative changes at L5-S1. Electronically Signed   By: Vanetta Chou M.D.   On: 01/10/2024 16:56   DG Chest Portable 1 View Result Date: 01/10/2024 CLINICAL DATA:  Sepsis. EXAM: PORTABLE CHEST 1 VIEW COMPARISON:  Chest radiograph 01/05/2014 FINDINGS: CT chest IMPRESSION: No active disease. Electronically Signed   By: Andrea Gasman M.D.   On: 01/10/2024 16:15    PROCEDURES and INTERVENTIONS:  .1-3 Lead EKG Interpretation  Performed by: Claudene Rover, MD Authorized by: Claudene Rover, MD     Interpretation: normal     ECG rate:  92   ECG rate assessment: normal     Rhythm: sinus rhythm     Ectopy: none     Conduction: normal   .Critical Care  Performed by: Claudene Rover, MD Authorized by: Claudene Rover, MD   Critical care provider statement:    Critical care time (minutes):  30   Critical care time was exclusive of:  Separately billable  procedures and treating other patients   Critical care was necessary to treat or prevent imminent or life-threatening deterioration of the following conditions:  Circulatory failure   Critical care was time spent personally by me on the following activities:  Development of treatment plan with patient or surrogate, discussions with consultants, evaluation of patient's response to treatment, examination of patient, ordering and review of laboratory studies, ordering and review of radiographic studies, ordering and performing treatments and interventions, pulse oximetry, re-evaluation of patient's condition and review of old charts   Medications  sodium chloride  0.9 % bolus 1,000 mL (0 mLs Intravenous Stopped 01/10/24 1700)  metoCLOPramide  (REGLAN ) injection 10 mg (10 mg Intravenous Given 01/10/24 1538)  lactated ringers  bolus 1,000 mL (0 mLs Intravenous Stopped 01/10/24 1700)  lactated ringers  bolus 1,000 mL (1,000 mLs Intravenous New Bag/Given 01/10/24 1756)     IMPRESSION / MDM / ASSESSMENT AND PLAN / ED COURSE  I reviewed the  triage vital signs and the nursing notes.  Differential diagnosis includes, but is not limited to, hypovolemia, AKI, sepsis, UTI, pancreatitis  {Patient presents with symptoms of an acute illness or injury that is potentially life-threatening.  Patient with gastroparesis and recurrent nausea vomiting presents with a few days of recurrent emesis found to have significant dehydration, acute renal failure requiring medical admission.  Suspect prerenal in the setting of volume losses.  Leukocytosis is noted in withdrawal cultures but no indication for antibiotics at this point.  Could be related to hemoconcentration and reactive from her emesis.  Reassuring imaging, x-ray and CT abdomen.  No obstructive uropathy or surgical pathology.  Awaiting UA.  BP initially soft but response IV fluids without indication for vasopressors.  Consult with hospitalist for admission  Clinical Course as of 01/10/24 TYRA Kitchens Jan 10, 2024  1529 Blood work noted, adjusted CT imaging to be noncontrast considering acute renal failure.  While her leukocytosis may be related to hemoconcentration and reactive in the setting of her recurrent emesis, sepsis remains a possibility. [DS]  1607 reassessed [DS]  1739 Reassessed and discussed renal failure, reassuring imaging.  Pending UA, admission and she is agreeable.  Pression of softening again, clinically she looks about the same.  Starting third liter of fluids [DS]  1908 With medicine who agrees to admit.  We discussed pending UA but remainder of workup. [DS]    Clinical Course User Index [DS] Claudene Rover, MD     FINAL CLINICAL IMPRESSION(S) / ED DIAGNOSES   Final diagnoses:  Hypotension due to hypovolemia  AKI (acute kidney injury) (HCC)  Nausea and vomiting, unspecified vomiting type     Rx / DC Orders   ED Discharge Orders     None        Note:  This document was prepared using Dragon voice recognition software and may include unintentional dictation  errors.   Claudene Rover, MD 01/10/24 1910

## 2024-01-10 NOTE — Assessment & Plan Note (Signed)
 Symptomatic support with aggressive fluid hydration, IVF and solution with LR 125 mm/h, 1 day ordered Reglan  10 mg every 6 hours, 3 doses ordered Ondansetron  4 mg IV every 6 hours.  For nausea, vomiting, 5 days ordered

## 2024-01-10 NOTE — ED Triage Notes (Signed)
 States that she has been having dizziness and intermittent LOC over the last 3 days. Had fall yesterday and states she had + LOC. Denies CP but is having dizziness. No appetite, Unable to hold down food x last 3 days as well. Coming to the hospital husband states she had to crawl to get to the car. BP in triage 72/44

## 2024-01-10 NOTE — Assessment & Plan Note (Signed)
 Home atorvastatin 40 mg daily resumed

## 2024-01-10 NOTE — H&P (Signed)
 History and Physical   Brenda Mccarthy FMW:[id number] DOB: 19-Jan-1976 DOA: 01/10/2024  PCP: Lora Odor, FNP  Outpatient Specialists: [facility] Patient coming from: home   I have personally briefly reviewed patient's old medical records in [facility] EMR.  Chief Concern: dizziness, lost of consciousness, intractable nausea and vomiting  HPI: Brenda Mccarthy is a 48 year old female with history of gastroparesis, hyperlipidemia, depression, insomnia, iddm2, gerd, who presents for chief concern of dizziness, intractable nausea and vomiting, and intermittent lost of consciousness over three days.   Vitals in ED showed T 97.5, rr 18, hr 99, blood pressure 72/44 and improved to 108/66, spO2 is 100% on RA.  Serum sodium 132, K 3.7, chloride 93, bicarb 19, BUN 71, sCr 4.71, eGFR 11, nonfasting blood glucose is 304, wbc 23.7, hgb 15.8, platelet 450.   Anion gap 20, alk phos 160, lactic acid 2.6 and on repeat is 1.9.  ED treatment: Reglan  10 mg IV one-time dose, LR 1 L bolus, x 2, sodium chloride  1 L bolus x 1. ---------------------------------- At bedside, patient able to tell me her first and last name, age, location, current calendar year.  She reports intractable nausea and vomiting with intermittent loss of consciousness for last 3 days.  She denies chest pain, fever, shortness of breath, cough, dysuria, hematuria, diarrhea.  She reports poor p.o. intake over the last 3 days and today she has not been able to urinate.  She feels the urge to urinate however is unable to.  Social history: She lives at home with her husband.  She denies tobacco, EtOH, recreational drug use.  ROS: Constitutional: no weight change, no fever ENT/Mouth: no sore throat, no rhinorrhea Eyes: no eye pain, no vision changes Cardiovascular: no chest pain, no dyspnea,  no edema, no palpitations Respiratory: no cough, no sputum, no wheezing Gastrointestinal: + nausea, + vomiting, no  diarrhea, no constipation Genitourinary: no urinary incontinence, no dysuria, no hematuria Musculoskeletal: no arthralgias, no myalgias Skin: no skin lesions, no pruritus, Neuro: + weakness, no loss of consciousness, no syncope Psych: no anxiety, no depression, + decrease appetite Heme/Lymph: no bruising, no bleeding  ED Course: Discussed with EDP, patient requiring hospitalization for chief concerns of intractable nausea and vomiting, acute kidney injury.  Assessment/Plan  Principal Problem:   Intractable nausea and vomiting Active Problems:   Gastroparesis   AKI (acute kidney injury) (HCC)   Uncontrolled type 2 diabetes mellitus with hyperglycemia, with long-term current use of insulin  (HCC)   Mixed hyperlipidemia   Depression   GERD (gastroesophageal reflux disease)   Assessment and Plan:  * Intractable nausea and vomiting Symptomatic support with aggressive fluid hydration, IVF and solution with LR 125 mm/h, 1 day ordered Reglan  10 mg every 6 hours, 3 doses ordered Ondansetron  4 mg IV every 6 hours.  For nausea, vomiting, 5 days ordered  AKI (acute kidney injury) (HCC) Prerenal, secondary to intractable nausea and vomiting Status post aggressive fluid hydration Continue with LR infusion at 125 mL/h, 1 day ordered Recheck BMP in a.m.  Gastroparesis Symptomatic support Reglan  10 mg IV every 6 hours, 3 days which was Ondansetron  4 mg IV every 6 hours as needed for nausea, vomiting  GERD (gastroesophageal reflux disease) Protonix  40 mg IV twice daily, 2 doses ordered  Depression Fluoxetine  20 mg daily  Mixed hyperlipidemia Home atorvastatin  40 mg daily resumed  Uncontrolled type 2 diabetes mellitus with hyperglycemia, with long-term current use of insulin  (HCC) Patient takes long-acting insulin  22 units nightly In  setting of intractable nausea and vomiting, poor p.o. intake, I have reduced long-acting insulin  to 10 units nightly, one-time dose AM team to resume  home long-acting insulin  dose when benefits outweigh the risk Insulin  SSI with at bedtime coverage ordered  Chart reviewed.   DVT prophylaxis: Heparin  5000 units subcutaneous every 8 hours Code Status: Full code Diet: Clear liquid diet; advance to heart healthy/carb modified diet Family Communication: A phone call was offered, patient declined stating that her husband knows that she is being admitted to the hospital Disposition Plan: Pending clinical Consults called: None at this time Admission status: Telemetry medical, inpatient  Past Medical History:  Diagnosis Date   Diabetes mellitus without complication (HCC)    Diabetic neuropathy (HCC)    Gastroparesis    Hydradenitis    Migraine    Past Surgical History:  Procedure Laterality Date   ANTERIOR CRUCIATE LIGAMENT REPAIR     TUBAL LIGATION     Social History:  reports that she has quit smoking. She has never used smokeless tobacco. She reports that she does not currently use alcohol. She reports current drug use. Drug: Marijuana.  Allergies  Allergen Reactions   Bee Venom Anaphylaxis   Metformin Diarrhea and Nausea And Vomiting   Other     Throat swelling with bleach   Family History  Problem Relation Age of Onset   Bipolar disorder Mother    Diabetes Mother    Heart disease Mother    Family history: Family history reviewed and not pertinent.  Prior to Admission medications   Medication Sig Start Date End Date Taking? Authorizing Provider  acetaminophen  (TYLENOL ) 500 MG tablet Take 500 mg by mouth every 6 (six) hours as needed for moderate pain (pain score 4-6).   Yes [provider]  acetaminophen -codeine (TYLENOL  #3) 300-30 MG tablet Take 1 tablet by mouth every 4 (four) hours as needed. 11/03/23  Yes [provider]  Aspirin -Salicylamide-Caffeine  (ARTHRITIS STRENGTH BC POWDER PO) Take 1 Dose by mouth 2 (two) times daily as needed (pain).   Yes [provider]  atorvastatin  (LIPITOR) 40  MG tablet Take 40 mg by mouth daily.   Yes [provider]  cetirizine (ZYRTEC) 10 MG tablet Take 10 mg by mouth daily.   Yes [provider]  cimetidine (TAGAMET) 200 MG tablet Take 200 mg by mouth daily as needed (heartburn).   Yes [provider]  diphenhydrAMINE  (BENADRYL ) 25 mg capsule Take 25 mg by mouth every 6 (six) hours as needed for allergies.   Yes [provider]  FLUoxetine  (PROZAC ) 20 MG capsule Take 20 mg by mouth daily.   Yes [provider]  ibuprofen (ADVIL) 800 MG tablet Take 800 mg by mouth every 6 (six) hours as needed. 11/03/23  Yes [provider]  insulin  aspart (NOVOLOG ) 100 UNIT/ML injection Inject 5 Units into the skin 3 (three) times daily with meals. 04/01/23  Yes Wieting, Richard, MD  insulin  glargine (LANTUS ) 100 UNIT/ML injection Inject 0.12 mLs (12 Units total) into the skin daily. Patient taking differently: Inject 22 Units into the skin at bedtime. 04/01/23  Yes Wieting, Richard, MD  metoCLOPramide  (REGLAN ) 10 MG tablet Take 1 tablet (10 mg total) by mouth every 6 (six) hours as needed for nausea. Patient taking differently: Take 10 mg by mouth in the morning, at noon, in the evening, and at bedtime. 12/05/20  Yes Willo Dunnings, MD  pantoprazole  (PROTONIX ) 40 MG tablet Take 40 mg by mouth 2 (two) times daily.  Yes [provider]  pyridostigmine  (MESTINON ) 60 MG tablet Take 60 mg by mouth. With evening meal 03/18/23 03/17/24 Yes [provider]  sucralfate  (CARAFATE ) 1 g tablet Take 1 g by mouth 4 (four) times daily.   Yes [provider]  traZODone  (DESYREL ) 50 MG tablet Take 50 mg by mouth at bedtime as needed for sleep.   Yes [provider]   Physical Exam: Vitals:   01/10/24 1630 01/10/24 1700 01/10/24 1730 01/10/24 1802  BP: (!) 86/65 (!) 94/59 (!) 78/50 108/66  Pulse: 86 86 86 88  Resp:   17 (!) 26  Temp:   98 F (36.7 C)   TempSrc:   Oral   SpO2: 99% 99% 97%  100%   Constitutional: appears older than stated age, weak Eyes: PERRL, lids and conjunctivae normal HENMT: Mucous membranes are moist. Posterior pharynx clear of any exudate or lesions. Age-appropriate dentition. Hearing appropriate.  Female pattern facial hair Neck: normal, supple, no masses, no thyromegaly Respiratory: clear to auscultation bilaterally, no wheezing, no crackles. Normal respiratory effort. No accessory muscle use.  Cardiovascular: Regular rate and rhythm, no murmurs / rubs / gallops. No extremity edema. 2+ pedal pulses. No carotid bruits.  Abdomen: Obese abdomen, no tenderness, no masses palpated, no hepatosplenomegaly. Bowel sounds positive.  Musculoskeletal: no clubbing / cyanosis. No joint deformity upper and lower extremities. Good ROM, no contractures, no atrophy. Normal muscle tone.  Skin: no rashes, lesions, ulcers. No induration Neurologic: Sensation intact. Strength 5/5 in all 4.  Psychiatric: Normal judgment and insight. Alert and oriented x 3. Normal mood.   EKG: independently reviewed, showing sinus tachycardia with rate of 101, QTc 495  Chest x-ray on Admission: I personally reviewed and I agree with radiologist reading as below.  CT ABDOMEN PELVIS WO CONTRAST Result Date: 01/10/2024 CLINICAL DATA:  Abdominal pain.  Vomiting.  Sepsis. EXAM: CT ABDOMEN AND PELVIS WITHOUT CONTRAST TECHNIQUE: Multidetector CT imaging of the abdomen and pelvis was performed following the standard protocol without IV contrast. RADIATION DOSE REDUCTION: This exam was performed according to the departmental dose-optimization program which includes automated exposure control, adjustment of the mA and/or kV according to patient size and/or use of iterative reconstruction technique. COMPARISON:  03/30/2023 FINDINGS: Lower chest: No acute findings. Hepatobiliary: No mass visualized on this unenhanced exam. Gallbladder is unremarkable. No evidence of biliary ductal dilatation. Pancreas: No mass  or inflammatory process visualized on this unenhanced exam. Spleen:  Within normal limits in size. Adrenals/Urinary tract: No evidence of urolithiasis or hydronephrosis. Unremarkable unopacified urinary bladder. Stomach/Bowel: No evidence of obstruction, inflammatory process, or abnormal fluid collections. Normal appendix visualized. Vascular/Lymphatic: No pathologically enlarged lymph nodes identified. No evidence of abdominal aortic aneurysm. Reproductive:  No mass or other significant abnormality. Other:  None. Musculoskeletal:  No suspicious bone lesions identified. IMPRESSION: No acute findings or other significant abnormality. Electronically Signed   By: Norleen DELENA Kil M.D.   On: 01/10/2024 17:00   CT L-SPINE NO CHARGE Result Date: 01/10/2024 CLINICAL DATA:  Fall and back pain. EXAM: CT LUMBAR SPINE WITHOUT CONTRAST TECHNIQUE: Multidetector CT imaging of the lumbar spine was performed without intravenous contrast administration. Multiplanar CT image reconstructions were also generated. RADIATION DOSE REDUCTION: This exam was performed according to the departmental dose-optimization program which includes automated exposure control, adjustment of the mA and/or kV according to patient size and/or use of iterative reconstruction technique. COMPARISON:  CT and pelvis dated 03/30/2023. FINDINGS: Segmentation: 5 lumbar type vertebrae. Alignment: Normal. Vertebrae: No acute fracture.  Paraspinal and other soft tissues: No paraspinal fluid collection or hematoma. Disc levels: Mild degenerative changes at L5-S1. No acute osseous pathology. IMPRESSION: 1. No acute/traumatic lumbar spine pathology. 2. Mild degenerative changes at L5-S1. Electronically Signed   By: Vanetta Chou M.D.   On: 01/10/2024 16:56   DG Chest Portable 1 View Result Date: 01/10/2024 CLINICAL DATA:  Sepsis. EXAM: PORTABLE CHEST 1 VIEW COMPARISON:  Chest radiograph 01/05/2014 FINDINGS: CT chest IMPRESSION: No active disease. Electronically  Signed   By: Andrea Gasman M.D.   On: 01/10/2024 16:15   Labs on Admission: I have personally reviewed following labs  CBC: Recent Labs  Lab 01/10/24 1442  WBC 23.7*  NEUTROABS 18.9*  HGB 15.8*  HCT 48.7*  MCV 85.9  PLT 450*   Basic Metabolic Panel: Recent Labs  Lab 01/10/24 1442  NA 132*  K 3.7  CL 93*  CO2 19*  GLUCOSE 304*  BUN 71*  CREATININE 4.71*  CALCIUM  10.2   GFR: CrCl cannot be calculated (Unknown ideal weight.).  Liver Function Tests: Recent Labs  Lab 01/10/24 1442  AST 18  ALT 19  ALKPHOS 160*  BILITOT 1.2  PROT 8.8*  ALBUMIN 4.6   Recent Labs  Lab 01/10/24 1442  LIPASE 46    Urine analysis:    Component Value Date/Time   COLORURINE YELLOW (A) 08/30/2023 0208   APPEARANCEUR CLOUDY (A) 08/30/2023 0208   APPEARANCEUR CLOUDY 05/29/2013 1801   LABSPEC 1.013 08/30/2023 0208   LABSPEC >=1.030 05/29/2013 1801   PHURINE 6.0 08/30/2023 0208   GLUCOSEU 50 (A) 08/30/2023 0208   GLUCOSEU NEGATIVE 05/29/2013 1801   HGBUR NEGATIVE 08/30/2023 0208   BILIRUBINUR NEGATIVE 08/30/2023 0208   BILIRUBINUR NEGATIVE 05/29/2013 1801   KETONESUR NEGATIVE 08/30/2023 0208   PROTEINUR 30 (A) 08/30/2023 0208   NITRITE NEGATIVE 08/30/2023 0208   LEUKOCYTESUR MODERATE (A) 08/30/2023 0208   LEUKOCYTESUR 2+ 05/29/2013 1801   This document was prepared using Dragon Voice Recognition software and may include unintentional dictation errors.  Dr. Sherre [facility]  If 7PM-7AM, please contact overnight-coverage provider If 7AM-7PM, please contact day attending provider [email or web address]  01/10/2024, 9:41 PM

## 2024-01-10 NOTE — Assessment & Plan Note (Signed)
 Patient takes long-acting insulin  22 units nightly In setting of intractable nausea and vomiting, poor p.o. intake, I have reduced long-acting insulin  to 10 units nightly, one-time dose AM team to resume home long-acting insulin  dose when benefits outweigh the risk Insulin  SSI with at bedtime coverage ordered

## 2024-01-11 ENCOUNTER — Other Ambulatory Visit: Payer: Self-pay

## 2024-01-11 DIAGNOSIS — R112 Nausea with vomiting, unspecified: Secondary | ICD-10-CM | POA: Diagnosis not present

## 2024-01-11 LAB — GLUCOSE, CAPILLARY
Glucose-Capillary: 157 mg/dL — ABNORMAL HIGH (ref 70–99)
Glucose-Capillary: 200 mg/dL — ABNORMAL HIGH (ref 70–99)
Glucose-Capillary: 212 mg/dL — ABNORMAL HIGH (ref 70–99)
Glucose-Capillary: 147 mg/dL — ABNORMAL HIGH (ref 70–99)

## 2024-01-11 LAB — CBC
HCT: 35.7 % — ABNORMAL LOW (ref 36.0–46.0)
Hemoglobin: 12.1 g/dL (ref 12.0–15.0)
MCH: 28.5 pg (ref 26.0–34.0)
MCHC: 33.9 g/dL (ref 30.0–36.0)
MCV: 84.2 fL (ref 80.0–100.0)
Platelets: 289 K/uL (ref 150–400)
RBC: 4.24 MIL/uL (ref 3.87–5.11)
RDW: 13.3 % (ref 11.5–15.5)
WBC: 10.3 K/uL (ref 4.0–10.5)
nRBC: 0 % (ref 0.0–0.2)

## 2024-01-11 LAB — BASIC METABOLIC PANEL WITH GFR
Anion gap: 15 (ref 5–15)
BUN: 64 mg/dL — ABNORMAL HIGH (ref 6–20)
CO2: 20 mmol/L — ABNORMAL LOW (ref 22–32)
Calcium: 9 mg/dL (ref 8.9–10.3)
Chloride: 101 mmol/L (ref 98–111)
Creatinine, Ser: 3.26 mg/dL — ABNORMAL HIGH (ref 0.44–1.00)
GFR, Estimated: 17 mL/min — ABNORMAL LOW (ref >60)
Glucose, Bld: 182 mg/dL — ABNORMAL HIGH (ref 70–99)
Potassium: 3.8 mmol/L (ref 3.5–5.1)
Sodium: 136 mmol/L (ref 135–145)

## 2024-01-11 LAB — URINALYSIS, ROUTINE W REFLEX MICROSCOPIC
Bilirubin Urine: NEGATIVE
Glucose, UA: >=500 mg/dL — AB
Ketones, ur: NEGATIVE mg/dL
Nitrite: NEGATIVE
Protein, ur: 30 mg/dL — AB
Specific Gravity, Urine: 1.008 (ref 1.005–1.030)
WBC, UA: >50 WBC/hpf (ref 0–5)
pH: 5 (ref 5.0–8.0)

## 2024-01-11 LAB — HIV ANTIBODY (ROUTINE TESTING W REFLEX): HIV Screen 4th Generation wRfx: NONREACTIVE

## 2024-01-11 MED ORDER — INSULIN GLARGINE-YFGN 100 UNIT/ML ~~LOC~~ SOLN
10.0000 [IU] | Freq: Every day | SUBCUTANEOUS | Status: DC
  Administered 2024-01-12: 10 [IU] via SUBCUTANEOUS
  Filled 2024-01-11 (×2): qty 0.1

## 2024-01-11 MED ORDER — INSULIN GLARGINE-YFGN 100 UNIT/ML ~~LOC~~ SOLN
10.0000 [IU] | Freq: Every day | SUBCUTANEOUS | Status: AC
  Administered 2024-01-11: 10 [IU] via SUBCUTANEOUS
  Filled 2024-01-11: qty 0.1

## 2024-01-11 MED ORDER — PANTOPRAZOLE SODIUM 40 MG PO TBEC
40.0000 mg | DELAYED_RELEASE_TABLET | Freq: Two times a day (BID) | ORAL | Status: DC
  Administered 2024-01-11 – 2024-01-13 (×4): 40 mg via ORAL
  Filled 2024-01-11 (×4): qty 1

## 2024-01-11 MED ORDER — LACTATED RINGERS IV SOLN: INTRAVENOUS | Status: AC

## 2024-01-11 NOTE — Progress Notes (Signed)
  PROGRESS NOTE    Brenda Mccarthy  FMW:[id number] DOB: 1975-12-10 DOA: 01/10/2024 PCP: Lora Odor, FNP  134A/134A-AA  LOS: 1 day   Brief hospital course:   Assessment & Plan: Brenda Mccarthy is a 48 year old female with history of gastroparesis, hyperlipidemia, depression, insomnia, iddm2, gerd, who presents for chief concern of dizziness, intractable nausea and vomiting, and intermittent lost of consciousness over three days.   Syncope 2/2 Hypotension 2/2 Hypovolemia --blood pressure 72/44 on presentation.  BP improved after 3L boluses. --cont MIVF@100 , ordered for 1 day   * Intractable nausea and vomiting 2/2 Gastroparesis  --No diarrhea.  N/V improved after presentation. --cont clear liquid diet for now --cont MIVF@100 , ordered for 1 day  Severe AKI (acute kidney injury) (HCC) Prerenal, secondary to intractable nausea and vomiting.  Cr 4.71 on presentation, improved to 3.26 the next day after aggressive fluid hydration with 3L boluses. --cont MIVF@100 , ordered for 1 day --monitor Cr  GERD (gastroesophageal reflux disease) --cont home PPI BID as oral  Depression Fluoxetine  20 mg daily  Mixed hyperlipidemia Cont atorvastatin  40 mg daily   Uncontrolled type 2 diabetes mellitus with hyperglycemia, with long-term current use of insulin  (HCC) --last A1c 8.6. --cont glargine 10u nightly --ACHS and SSI   DVT prophylaxis: Heparin  SQ Code Status: Full code  Family Communication:  Level of care: Telemetry Medical Dispo:   The patient is from: home Anticipated d/c is to: home Anticipated d/c date is: 2-3 days   Subjective and Interval History:  Pt reported feeling much better.  Started to urinate more.   Objective: Vitals:   01/11/24 0342 01/11/24 0728 01/11/24 1618 01/11/24 2005  BP: (!) 101/54 (!) 100/57 (!) 144/79 (!) 147/75  Pulse: 81 85 84 85  Resp: 16 16 17 14   Temp: 98 F (36.7 C) 98.6 F (37 C) 97.8 F (36.6 C) 98.6 F (37 C)   TempSrc:  Oral Oral Oral  SpO2: 100% 100% 100% 100%  Weight:      Height:        Intake/Output Summary (Last 24 hours) at 01/11/2024 2114 Last data filed at 01/11/2024 1602 Gross per 24 hour  Intake 1887.31 ml  Output 700 ml  Net 1187.31 ml   Filed Weights   01/10/24 2227  Weight: 81.1 kg    Examination:   Constitutional: NAD, AAOx3 HEENT: conjunctivae and lids normal, EOMI CV: No cyanosis.   RESP: normal respiratory effort, on RA Neuro: II - XII grossly intact.   Psych: Normal mood and affect.  Appropriate judgement and reason   Data Reviewed: I have personally reviewed labs and imaging studies  Time spent: 50 minutes  Ellouise Haber, MD [facility] If 7PM-7AM, please contact night-coverage 01/11/2024, 9:14 PM

## 2024-01-11 NOTE — Plan of Care (Signed)
  Problem: Coping: Goal: Ability to adjust to condition or change in health will improve 01/11/2024 0343 by Peri Magdalena LABOR, RN Outcome: Progressing 01/11/2024 0343 by Peri Magdalena LABOR, RN Outcome: Progressing   Problem: Health Behavior/Discharge Planning: Goal: Ability to manage health-related needs will improve 01/11/2024 0343 by Peri Magdalena LABOR, RN Outcome: Progressing 01/11/2024 0343 by Peri Magdalena LABOR, RN Outcome: Progressing   Problem: Education: Goal: Knowledge of General Education information will improve Description: Including pain rating scale, medication(s)/side effects and non-pharmacologic comfort measures 01/11/2024 0343 by Peri Magdalena LABOR, RN Outcome: Progressing 01/11/2024 0343 by Peri Magdalena LABOR, RN Outcome: Progressing   Problem: Nutrition: Goal: Adequate nutrition will be maintained 01/11/2024 0343 by Peri Magdalena LABOR, RN Outcome: Progressing 01/11/2024 0343 by Peri Magdalena LABOR, RN Outcome: Progressing   Problem: Coping: Goal: Level of anxiety will decrease 01/11/2024 0343 by Peri Magdalena LABOR, RN Outcome: Progressing 01/11/2024 0343 by Peri Magdalena LABOR, RN Outcome: Progressing   Problem: Elimination: Goal: Will not experience complications related to bowel motility 01/11/2024 0343 by Peri Magdalena LABOR, RN Outcome: Progressing 01/11/2024 0343 by Peri Magdalena LABOR, RN Outcome: Progressing Goal: Will not experience complications related to urinary retention 01/11/2024 0343 by Peri Magdalena LABOR, RN Outcome: Progressing 01/11/2024 0343 by Peri Magdalena LABOR, RN Outcome: Progressing   Problem: Pain Managment: Goal: General experience of comfort will improve and/or be controlled 01/11/2024 0343 by Peri Magdalena LABOR, RN Outcome: Progressing 01/11/2024 0343 by Peri Magdalena LABOR, RN Outcome: Progressing   Problem: Safety: Goal: Ability to remain free from injury will improve 01/11/2024 0343 by Peri Magdalena LABOR, RN Outcome: Progressing 01/11/2024 0343 by Peri Magdalena LABOR, RN Outcome:  Progressing

## 2024-01-11 NOTE — Inpatient Diabetes Management (Signed)
 Inpatient Diabetes Program Recommendations  AACE/ADA: New Consensus Statement on Inpatient Glycemic Control (2015)  Target Ranges:  Prepandial:   less than 140 mg/dL      Peak postprandial:   less than 180 mg/dL (1-2 hours)      Critically ill patients:  140 - 180 mg/dL   Lab Results  Component Value Date   GLUCAP 157 (H) 01/11/2024   HGBA1C 8.6 (H) 08/29/2023    Review of Glycemic Control  Latest Reference Range & Units 01/10/24 22:37 01/11/24 07:31  Glucose-Capillary 70 - 99 mg/dL 754 (H) 842 (H)   Diabetes history: DM 2 Outpatient Diabetes medications:  Lantus  22 units q HS Novolog  5 units tid with meals  Current orders for Inpatient glycemic control:  Novolog  0-9 units tid with meals and HS  Inpatient Diabetes Program Recommendations:   Note patient received Semglee  10 units x 1 last night.  CBG this AM is 157 mg/dL.  Consider adding Semglee  10 units q HS.    Thanks,  Randall Bullocks, RN, BC-ADM Inpatient Diabetes Coordinator Pager [phone]  (8a-5p)

## 2024-01-11 NOTE — TOC Initial Note (Signed)
 Transition of Care [facility]) - Initial/Assessment Note    Patient Details  Name: Brenda Mccarthy MRN: [id number] Date of Birth: 07/18/1975  [facility]) CM/SW Contact:    Marinda Cooks, RN Phone Number: 01/11/2024, 3:57 PM  Clinical Narrative:                 This CM arrived at bedside and completed Initial assessement. Pt A&Ox4 Pt reports living in a  mobile home with 8 steps to enter. Pt has family support provided by husband primarily Pt uses  [facility] pharmacy in [place] & PCP is Dr. Alfonso Grow @ [facility] and has DME at home that includes ,Straight cane, RW,& WC. Pt reports not  being in SNF prior to  this admission.or having  HH services established. Pt denies established  community resources out patient .TOC will cont to follow dc planning / care coordination and update as applicable.   Expected Discharge Plan:  (TBD) Barriers to Discharge: No Barriers Identified   Expected Discharge Plan and Services   Discharge Planning Services: Other - See comment (TBD)   Living arrangements for the past 2 months: Mobile Home (with 8 steps to enter)                     Prior Living Arrangements/Services Living arrangements for the past 2 months: Mobile Home (with 8 steps to enter) Lives with:: Spouse Patient language and need for interpreter reviewed:: No        Need for Family Participation in Patient Care: No (Comment) Care giver support system in place?: Yes (comment) Current home services: DME, Other (comment) (Pt shared she has a RW,WC,& staright Cane)    Activities of Daily Living   ADL Screening (condition at time of admission) Independently performs ADLs?: Yes (appropriate for developmental age) Is the patient deaf or have difficulty hearing?: No Does the patient have difficulty seeing, even when wearing glasses/contacts?: No Does the patient have difficulty concentrating, remembering, or making decisions?: No  Permission  Sought/Granted   Emotional Assessment Appearance:: Appears stated age, Developmentally appropriate   Affect (typically observed): Accepting, Appropriate Orientation: : Oriented to Self, Oriented to Place, Oriented to  Time, Oriented to Situation   Psych Involvement: No (comment)  Admission diagnosis:  AKI (acute kidney injury) (HCC) [N17.9] Intractable nausea and vomiting [R11.2] Hypotension due to hypovolemia [E86.1] Nausea and vomiting, unspecified vomiting type [R11.2] Patient Active Problem List   Diagnosis Date Noted   GERD (gastroesophageal reflux disease) 01/10/2024   Intractable nausea and vomiting 08/30/2023   Elevated troponin 08/29/2023   Diarrhea 03/31/2023   Uncontrolled type 2 diabetes mellitus with hypoglycemia, with long-term current use of insulin  (HCC) 03/31/2023   Overweight (BMI 25.0-29.9) 03/31/2023   QT prolongation 03/31/2023   Myocardial ischemia 03/31/2023   Syncope 03/31/2023   Gastroparesis 12/14/2022   AKI (acute kidney injury) (HCC) 12/14/2022   Uncontrolled type 2 diabetes mellitus with hyperglycemia, with long-term current use of insulin  (HCC) 12/14/2022   Obesity (BMI 30-39.9) 12/14/2022   Mixed hyperlipidemia 12/14/2022   Depression 12/14/2022   PCP:  Grow Alfonso, FNP Pharmacy:   [facility] [phone] [address], [place] - [address] AT [facility] [address] [place] [address] [place] [address] Phone: [phone] Fax: [phone]     Social Drivers of Health (SDOH) Social History: SDOH Screenings   Food Insecurity: Food Insecurity Present (01/10/2024)  Housing: Low Risk  (01/10/2024)  Transportation Needs:  No Transportation Needs (01/10/2024)  Utilities: At Risk (01/10/2024)  Financial Resource Strain: Medium Risk (12/08/2023)   Received from [facility]  Physical Activity: Inactive (10/04/2023)   Received from [facility]  Social Connections: Socially Isolated (10/04/2023)   Received from [facility]   Stress: No Stress Concern Present (10/04/2023)   Received from [facility]  Tobacco Use: Medium Risk (01/10/2024)  Health Literacy: Low Risk  (10/04/2023)   Received from [facility]   SDOH Interventions:     Readmission Risk Interventions     No data to display

## 2024-01-12 DIAGNOSIS — R112 Nausea with vomiting, unspecified: Secondary | ICD-10-CM | POA: Diagnosis not present

## 2024-01-12 LAB — BASIC METABOLIC PANEL WITH GFR
Anion gap: 6 (ref 5–15)
BUN: 36 mg/dL — ABNORMAL HIGH (ref 6–20)
CO2: 26 mmol/L (ref 22–32)
Calcium: 8.7 mg/dL — ABNORMAL LOW (ref 8.9–10.3)
Chloride: 104 mmol/L (ref 98–111)
Creatinine, Ser: 1.78 mg/dL — ABNORMAL HIGH (ref 0.44–1.00)
GFR, Estimated: 34 mL/min — ABNORMAL LOW (ref >60)
Glucose, Bld: 169 mg/dL — ABNORMAL HIGH (ref 70–99)
Potassium: 3.3 mmol/L — ABNORMAL LOW (ref 3.5–5.1)
Sodium: 136 mmol/L (ref 135–145)

## 2024-01-12 LAB — CBC
HCT: 34.7 % — ABNORMAL LOW (ref 36.0–46.0)
Hemoglobin: 11.5 g/dL — ABNORMAL LOW (ref 12.0–15.0)
MCH: 28.1 pg (ref 26.0–34.0)
MCHC: 33.1 g/dL (ref 30.0–36.0)
MCV: 84.8 fL (ref 80.0–100.0)
Platelets: 233 K/uL (ref 150–400)
RBC: 4.09 MIL/uL (ref 3.87–5.11)
RDW: 13.2 % (ref 11.5–15.5)
WBC: 8.7 K/uL (ref 4.0–10.5)
nRBC: 0 % (ref 0.0–0.2)

## 2024-01-12 LAB — GLUCOSE, CAPILLARY
Glucose-Capillary: 142 mg/dL — ABNORMAL HIGH (ref 70–99)
Glucose-Capillary: 168 mg/dL — ABNORMAL HIGH (ref 70–99)
Glucose-Capillary: 162 mg/dL — ABNORMAL HIGH (ref 70–99)

## 2024-01-12 LAB — MAGNESIUM: Magnesium: 1.3 mg/dL — ABNORMAL LOW (ref 1.7–2.4)

## 2024-01-12 MED ORDER — POTASSIUM CHLORIDE CRYS ER 20 MEQ PO TBCR
40.0000 meq | EXTENDED_RELEASE_TABLET | Freq: Once | ORAL | Status: AC
  Administered 2024-01-12: 40 meq via ORAL
  Filled 2024-01-12: qty 2

## 2024-01-12 MED ORDER — MAGNESIUM SULFATE 2 GM/50ML IV SOLN
2.0000 g | Freq: Once | INTRAVENOUS | Status: AC
  Administered 2024-01-12: 2 g via INTRAVENOUS
  Filled 2024-01-12: qty 50

## 2024-01-12 NOTE — Plan of Care (Signed)

## 2024-01-12 NOTE — Progress Notes (Signed)
 PROGRESS NOTE    Brenda Mccarthy  FMW:[id number] DOB: [birth year] DOA: 01/10/2024 PCP: Lora Odor, FNP   Assessment & Plan:   Principal Problem:   Intractable nausea and vomiting Active Problems:   Gastroparesis   AKI (acute kidney injury) (HCC)   Uncontrolled type 2 diabetes mellitus with hyperglycemia, with long-term current use of insulin  (HCC)   Mixed hyperlipidemia   Depression   GERD (gastroesophageal reflux disease)  Assessment and Plan: Syncope: likely secondary to hypotension & hypovolemia. Improved w/ IVFs. Hypotension has resolved  Intractable nausea and vomiting: no vomiting today so far. Intermittent nausea. Zofran  prn. Takes reglan  at home. Advanced diet to soft diet for lunch and will advance as tolerated   AKI: Cr is trending down daily. Much improved after IVFs   GERD: continue on PPI    Depression: severity unknown. Continue on home dose of fluoxetine     HLD: continue on statin   DM2: poorly controlled, HbA1c 8.6. Continue on glargine, SSI w/ accuchecks        DVT prophylaxis: heparin   Code Status: full Family Communication: Disposition Plan: likely d/c home tomorrow if tolerating diet   Level of care: Telemetry Medical  Status is: Inpatient Remains inpatient appropriate because: severity of illness '  Consultants:    Procedures:   Antimicrobials   Subjective: Pt c/o intermittent nausea   Objective: Vitals:   01/11/24 1618 01/11/24 2005 01/12/24 0333 01/12/24 0748  BP: (!) 144/79 (!) 147/75 (!) 144/67 (!) 155/73  Pulse: 84 85 83 88  Resp: 17 14 16 16   Temp: 97.8 F (36.6 C) 98.6 F (37 C) 98.8 F (37.1 C) 99.8 F (37.7 C)  TempSrc: Oral Oral Oral Oral  SpO2: 100% 100% 99% 98%  Weight:      Height:        Intake/Output Summary (Last 24 hours) at 01/12/2024 0957 Last data filed at 01/11/2024 1602 Gross per 24 hour  Intake 503.97 ml  Output --  Net 503.97 ml   Filed Weights   01/10/24 2227  Weight: 81.1  kg    Examination:  General exam: Appears calm and comfortable  Respiratory system: Clear to auscultation. Respiratory effort normal. Cardiovascular system: S1 & S2 +. No rubs, gallops or clicks. No pedal edema. Gastrointestinal system: Abdomen is nondistended, soft and nontender. Normal bowel sounds heard. Central nervous system: Alert and oriented. Moves all extremities Psychiatry: Judgement and insight appear normal. Flat mood and affect    Data Reviewed: I have personally reviewed following labs and imaging studies  CBC: Recent Labs  Lab 01/10/24 1442 01/11/24 0446 01/12/24 0412  WBC 23.7* 10.3 8.7  NEUTROABS 18.9*  --   --   HGB 15.8* 12.1 11.5*  HCT 48.7* 35.7* 34.7*  MCV 85.9 84.2 84.8  PLT 450* 289 233   Basic Metabolic Panel: Recent Labs  Lab 01/10/24 1442 01/11/24 0446 01/12/24 0412  NA 132* 136 136  K 3.7 3.8 3.3*  CL 93* 101 104  CO2 19* 20* 26  GLUCOSE 304* 182* 169*  BUN 71* 64* 36*  CREATININE 4.71* 3.26* 1.78*  CALCIUM  10.2 9.0 8.7*  MG  --   --  1.3*   GFR: Estimated Creatinine Clearance: 43.1 mL/min (A) (by C-G formula based on SCr of 1.78 mg/dL (H)). Liver Function Tests: Recent Labs  Lab 01/10/24 1442  AST 18  ALT 19  ALKPHOS 160*  BILITOT 1.2  PROT 8.8*  ALBUMIN 4.6   Recent Labs  Lab 01/10/24 1442  LIPASE 46  No results for input(s): AMMONIA in the last 168 hours. Coagulation Profile: No results for input(s): INR, PROTIME in the last 168 hours. Cardiac Enzymes: No results for input(s): CKTOTAL, CKMB, CKMBINDEX, TROPONINI in the last 168 hours. BNP (last 3 results) No results for input(s): PROBNP in the last 8760 hours. HbA1C: No results for input(s): HGBA1C in the last 72 hours. CBG: Recent Labs  Lab 01/11/24 0731 01/11/24 1128 01/11/24 1620 01/11/24 2056 01/12/24 0820  GLUCAP 157* 200* 212* 147* 142*   Lipid Profile: No results for input(s): CHOL, HDL, LDLCALC, TRIG, CHOLHDL,  LDLDIRECT in the last 72 hours. Thyroid Function Tests: No results for input(s): TSH, T4TOTAL, FREET4, T3FREE, THYROIDAB in the last 72 hours. Anemia Panel: No results for input(s): VITAMINB12, FOLATE, FERRITIN, TIBC, IRON, RETICCTPCT in the last 72 hours. Sepsis Labs: Recent Labs  Lab 01/10/24 1442 01/10/24 1750  LATICACIDVEN 2.6* 1.9    Recent Results (from the past 240 hours)  Blood culture (single)     Status: None (Preliminary result)   Collection Time: 01/10/24  2:42 PM   Specimen: BLOOD  Result Value Ref Range Status   Specimen Description BLOOD BLOOD LEFT ARM  Final   Special Requests   Final    BOTTLES DRAWN AEROBIC AND ANAEROBIC Blood Culture results may not be optimal due to an inadequate volume of blood received in culture bottles   Culture   Final    NO GROWTH 2 DAYS Performed at [facility], [address], [place], [place] [address]    Report Status PENDING  Incomplete         Radiology Studies: CT ABDOMEN PELVIS WO CONTRAST Result Date: 01/10/2024 CLINICAL DATA:  Abdominal pain.  Vomiting.  Sepsis. EXAM: CT ABDOMEN AND PELVIS WITHOUT CONTRAST TECHNIQUE: Multidetector CT imaging of the abdomen and pelvis was performed following the standard protocol without IV contrast. RADIATION DOSE REDUCTION: This exam was performed according to the departmental dose-optimization program which includes automated exposure control, adjustment of the mA and/or kV according to patient size and/or use of iterative reconstruction technique. COMPARISON:  03/30/2023 FINDINGS: Lower chest: No acute findings. Hepatobiliary: No mass visualized on this unenhanced exam. Gallbladder is unremarkable. No evidence of biliary ductal dilatation. Pancreas: No mass or inflammatory process visualized on this unenhanced exam. Spleen:  Within normal limits in size. Adrenals/Urinary tract: No evidence of urolithiasis or hydronephrosis. Unremarkable unopacified urinary  bladder. Stomach/Bowel: No evidence of obstruction, inflammatory process, or abnormal fluid collections. Normal appendix visualized. Vascular/Lymphatic: No pathologically enlarged lymph nodes identified. No evidence of abdominal aortic aneurysm. Reproductive:  No mass or other significant abnormality. Other:  None. Musculoskeletal:  No suspicious bone lesions identified. IMPRESSION: No acute findings or other significant abnormality. Electronically Signed   By: Norleen DELENA Kil M.D.   On: 01/10/2024 17:00   CT L-SPINE NO CHARGE Result Date: 01/10/2024 CLINICAL DATA:  Fall and back pain. EXAM: CT LUMBAR SPINE WITHOUT CONTRAST TECHNIQUE: Multidetector CT imaging of the lumbar spine was performed without intravenous contrast administration. Multiplanar CT image reconstructions were also generated. RADIATION DOSE REDUCTION: This exam was performed according to the departmental dose-optimization program which includes automated exposure control, adjustment of the mA and/or kV according to patient size and/or use of iterative reconstruction technique. COMPARISON:  CT and pelvis dated 03/30/2023. FINDINGS: Segmentation: 5 lumbar type vertebrae. Alignment: Normal. Vertebrae: No acute fracture. Paraspinal and other soft tissues: No paraspinal fluid collection or hematoma. Disc levels: Mild degenerative changes at L5-S1. No acute osseous pathology. IMPRESSION: 1. No  acute/traumatic lumbar spine pathology. 2. Mild degenerative changes at L5-S1. Electronically Signed   By: Vanetta Chou M.D.   On: 01/10/2024 16:56   DG Chest Portable 1 View Result Date: 01/10/2024 CLINICAL DATA:  Sepsis. EXAM: PORTABLE CHEST 1 VIEW COMPARISON:  Chest radiograph 01/05/2014 FINDINGS: CT chest IMPRESSION: No active disease. Electronically Signed   By: Andrea Gasman M.D.   On: 01/10/2024 16:15        Scheduled Meds:  atorvastatin   40 mg Oral Daily   FLUoxetine   20 mg Oral Daily   heparin   5,000 Units Subcutaneous Q8H   insulin   aspart  0-5 Units Subcutaneous QHS   insulin  aspart  0-9 Units Subcutaneous TID WC   insulin  glargine-yfgn  10 Units Subcutaneous QHS   pantoprazole   40 mg Oral BID   pyridostigmine   60 mg Oral QPM   Continuous Infusions:  lactated ringers  100 mL/hr at 01/12/24 9387     LOS: 2 days        Anthony CHRISTELLA Pouch, MD [facility] Pager [phone]  If 7PM-7AM, please contact night-coverage [email or web address] 01/12/2024, 9:57 AM

## 2024-01-13 DIAGNOSIS — R112 Nausea with vomiting, unspecified: Secondary | ICD-10-CM | POA: Diagnosis not present

## 2024-01-13 LAB — BASIC METABOLIC PANEL WITH GFR
Anion gap: 8 (ref 5–15)
BUN: 21 mg/dL — ABNORMAL HIGH (ref 6–20)
CO2: 27 mmol/L (ref 22–32)
Calcium: 8.6 mg/dL — ABNORMAL LOW (ref 8.9–10.3)
Chloride: 101 mmol/L (ref 98–111)
Creatinine, Ser: 1.33 mg/dL — ABNORMAL HIGH (ref 0.44–1.00)
GFR, Estimated: 49 mL/min — ABNORMAL LOW (ref >60)
Glucose, Bld: 131 mg/dL — ABNORMAL HIGH (ref 70–99)
Potassium: 4 mmol/L (ref 3.5–5.1)
Sodium: 136 mmol/L (ref 135–145)

## 2024-01-13 LAB — CBC
HCT: 33.5 % — ABNORMAL LOW (ref 36.0–46.0)
Hemoglobin: 11.2 g/dL — ABNORMAL LOW (ref 12.0–15.0)
MCH: 28.6 pg (ref 26.0–34.0)
MCHC: 33.4 g/dL (ref 30.0–36.0)
MCV: 85.5 fL (ref 80.0–100.0)
Platelets: 218 K/uL (ref 150–400)
RBC: 3.92 MIL/uL (ref 3.87–5.11)
RDW: 13 % (ref 11.5–15.5)
WBC: 8.5 K/uL (ref 4.0–10.5)
nRBC: 0 % (ref 0.0–0.2)

## 2024-01-13 LAB — GLUCOSE, CAPILLARY
Glucose-Capillary: 128 mg/dL — ABNORMAL HIGH (ref 70–99)
Glucose-Capillary: 206 mg/dL — ABNORMAL HIGH (ref 70–99)

## 2024-01-13 LAB — MAGNESIUM: Magnesium: 1.6 mg/dL — ABNORMAL LOW (ref 1.7–2.4)

## 2024-01-13 MED ORDER — SUCRALFATE 1 G PO TABS
1.0000 g | ORAL_TABLET | Freq: Four times a day (QID) | ORAL | Status: DC
  Administered 2024-01-13: 1 g via ORAL
  Filled 2024-01-13: qty 1

## 2024-01-13 NOTE — TOC Transition Note (Signed)
 Transition of Care [facility]) - Discharge Note   Patient Details  Name: Brenda Mccarthy MRN: [id number] Date of Birth: [birth year]  [facility] CM/SW Contact:  Elouise LULLA Capri, RN 01/13/2024, 5:14 PM   Clinical Narrative:     Discharge orders noted. Private transportation arranged. No identified needs.   Final next level of care: Home/Self Care Barriers to Discharge: No Barriers Identified   Patient Goals and CMS Choice    Home/self care  Discharge Placement      Home/self care           Discharge Plan and Services Additional resources added to the After Visit Summary for     Discharge Planning Services: Other - See comment (TBD)    Social Drivers of Health (SDOH) Interventions SDOH Screenings   Food Insecurity: Food Insecurity Present (01/10/2024)  Housing: Low Risk  (01/10/2024)  Transportation Needs: No Transportation Needs (01/10/2024)  Utilities: At Risk (01/10/2024)  Financial Resource Strain: Medium Risk (12/08/2023)   Received from [facility]  Physical Activity: Inactive (10/04/2023)   Received from [facility]  Social Connections: Socially Isolated (10/04/2023)   Received from [facility]  Stress: No Stress Concern Present (10/04/2023)   Received from [facility]  Tobacco Use: Medium Risk (01/10/2024)  Health Literacy: Low Risk  (10/04/2023)   Received from [facility]     Readmission Risk Interventions     No data to display

## 2024-01-13 NOTE — Discharge Summary (Addendum)
 Physician Discharge Summary  Evian Derringer Pippenger FMW:[id number] DOB: 10-05-1975 DOA: 01/10/2024  PCP: Lora Odor, FNP  Admit date: 01/10/2024 Discharge date: 01/13/2024  Admitted From: home  Disposition:  home   Recommendations for Outpatient Follow-up:  Follow up with PCP in 1-2 weeks   Home Health: no  Equipment/Devices:  Discharge Condition: stable  CODE STATUS: full  Diet recommendation:Carb Modified Brief/Interim Summary: HPI was taken from Dr. Sherre: Ms. Charnae Lill is a 48 year old female with history of gastroparesis, hyperlipidemia, depression, insomnia, iddm2, gerd, who presents for chief concern of dizziness, intractable nausea and vomiting, and intermittent lost of consciousness over three days.    Vitals in ED showed T 97.5, rr 18, hr 99, blood pressure 72/44 and improved to 108/66, spO2 is 100% on RA.   Serum sodium 132, K 3.7, chloride 93, bicarb 19, BUN 71, sCr 4.71, eGFR 11, nonfasting blood glucose is 304, wbc 23.7, hgb 15.8, platelet 450.    Anion gap 20, alk phos 160, lactic acid 2.6 and on repeat is 1.9.   ED treatment: Reglan  10 mg IV one-time dose, LR 1 L bolus, x 2, sodium chloride  1 L bolus x 1. ---------------------------------- At bedside, patient able to tell me her first and last name, age, location, current calendar year.   She reports intractable nausea and vomiting with intermittent loss of consciousness for last 3 days.   She denies chest pain, fever, shortness of breath, cough, dysuria, hematuria, diarrhea.   She reports poor p.o. intake over the last 3 days and today she has not been able to urinate.  She feels the urge to urinate however is unable to.  Discharge Diagnoses:  Principal Problem:   Intractable nausea and vomiting Active Problems:   Gastroparesis   AKI (acute kidney injury) (HCC)   Uncontrolled type 2 diabetes mellitus with hyperglycemia, with long-term current use of insulin  (HCC)   Mixed hyperlipidemia    Depression   GERD (gastroesophageal reflux disease)  Syncope: likely secondary to hypotension & hypovolemia. Improved w/ IVFs. Hypotension has resolved   Intractable nausea and vomiting: vomiting has resolved. Intermittent nausea. Zofran  prn. Takes reglan  at home. Diet advanced to carb modified diet    AKI: Cr continues to trend down daily    GERD: continue on PPI    Depression: severity unknown. Continue on home dose of fluoxetine     HLD: continue on statin    DM2: poorly controlled, HbA1c 8.6. Continue on glargine, SSI w/ accuchecks   Discharge Instructions  Discharge Instructions     Diet Carb Modified   Complete by: As directed    Discharge instructions   Complete by: As directed    F/u w/ PCP in 1-2 weeks. Will need to get BMP to check Cr/GFR (kidney function) within 1-2 weeks   Increase activity slowly   Complete by: As directed       Allergies as of 01/13/2024       Reactions   Bee Venom Anaphylaxis   Metformin Diarrhea, Nausea And Vomiting   Other    Throat swelling with bleach        Medication List     STOP taking these medications    ibuprofen 800 MG tablet Commonly known as: ADVIL       TAKE these medications    acetaminophen  500 MG tablet Commonly known as: TYLENOL  Take 500 mg by mouth every 6 (six) hours as needed for moderate pain (pain score 4-6).   acetaminophen -codeine 300-30 MG tablet Commonly  known as: TYLENOL  #3 Take 1 tablet by mouth every 4 (four) hours as needed.   ARTHRITIS STRENGTH BC POWDER PO Take 1 Dose by mouth 2 (two) times daily as needed (pain).   atorvastatin  40 MG tablet Commonly known as: LIPITOR Take 40 mg by mouth daily.   cetirizine 10 MG tablet Commonly known as: ZYRTEC Take 10 mg by mouth daily.   cimetidine 200 MG tablet Commonly known as: TAGAMET Take 200 mg by mouth daily as needed (heartburn).   diphenhydrAMINE  25 mg capsule Commonly known as: BENADRYL  Take 25 mg by mouth every 6 (six) hours as  needed for allergies.   FLUoxetine  20 MG capsule Commonly known as: PROZAC  Take 20 mg by mouth daily.   insulin  aspart 100 UNIT/ML injection Commonly known as: novoLOG  Inject 5 Units into the skin 3 (three) times daily with meals.   insulin  glargine 100 UNIT/ML injection Commonly known as: LANTUS  Inject 0.12 mLs (12 Units total) into the skin daily. What changed:  how much to take when to take this   metoCLOPramide  10 MG tablet Commonly known as: REGLAN  Take 1 tablet (10 mg total) by mouth every 6 (six) hours as needed for nausea. What changed: when to take this   pantoprazole  40 MG tablet Commonly known as: PROTONIX  Take 40 mg by mouth 2 (two) times daily.   pyridostigmine  60 MG tablet Commonly known as: MESTINON  Take 60 mg by mouth. With evening meal   sucralfate  1 g tablet Commonly known as: CARAFATE  Take 1 g by mouth 4 (four) times daily.   traZODone  50 MG tablet Commonly known as: DESYREL  Take 50 mg by mouth at bedtime as needed for sleep.        Allergies  Allergen Reactions   Bee Venom Anaphylaxis   Metformin Diarrhea and Nausea And Vomiting   Other     Throat swelling with bleach    Consultations:    Procedures/Studies: CT ABDOMEN PELVIS WO CONTRAST Result Date: 01/10/2024 CLINICAL DATA:  Abdominal pain.  Vomiting.  Sepsis. EXAM: CT ABDOMEN AND PELVIS WITHOUT CONTRAST TECHNIQUE: Multidetector CT imaging of the abdomen and pelvis was performed following the standard protocol without IV contrast. RADIATION DOSE REDUCTION: This exam was performed according to the departmental dose-optimization program which includes automated exposure control, adjustment of the mA and/or kV according to patient size and/or use of iterative reconstruction technique. COMPARISON:  03/30/2023 FINDINGS: Lower chest: No acute findings. Hepatobiliary: No mass visualized on this unenhanced exam. Gallbladder is unremarkable. No evidence of biliary ductal dilatation. Pancreas: No  mass or inflammatory process visualized on this unenhanced exam. Spleen:  Within normal limits in size. Adrenals/Urinary tract: No evidence of urolithiasis or hydronephrosis. Unremarkable unopacified urinary bladder. Stomach/Bowel: No evidence of obstruction, inflammatory process, or abnormal fluid collections. Normal appendix visualized. Vascular/Lymphatic: No pathologically enlarged lymph nodes identified. No evidence of abdominal aortic aneurysm. Reproductive:  No mass or other significant abnormality. Other:  None. Musculoskeletal:  No suspicious bone lesions identified. IMPRESSION: No acute findings or other significant abnormality. Electronically Signed   By: Norleen DELENA Kil M.D.   On: 01/10/2024 17:00   CT L-SPINE NO CHARGE Result Date: 01/10/2024 CLINICAL DATA:  Fall and back pain. EXAM: CT LUMBAR SPINE WITHOUT CONTRAST TECHNIQUE: Multidetector CT imaging of the lumbar spine was performed without intravenous contrast administration. Multiplanar CT image reconstructions were also generated. RADIATION DOSE REDUCTION: This exam was performed according to the departmental dose-optimization program which includes automated exposure control, adjustment of the mA and/or kV according  to patient size and/or use of iterative reconstruction technique. COMPARISON:  CT and pelvis dated 03/30/2023. FINDINGS: Segmentation: 5 lumbar type vertebrae. Alignment: Normal. Vertebrae: No acute fracture. Paraspinal and other soft tissues: No paraspinal fluid collection or hematoma. Disc levels: Mild degenerative changes at L5-S1. No acute osseous pathology. IMPRESSION: 1. No acute/traumatic lumbar spine pathology. 2. Mild degenerative changes at L5-S1. Electronically Signed   By: Vanetta Chou M.D.   On: 01/10/2024 16:56   DG Chest Portable 1 View Result Date: 01/10/2024 CLINICAL DATA:  Sepsis. EXAM: PORTABLE CHEST 1 VIEW COMPARISON:  Chest radiograph 01/05/2014 FINDINGS: CT chest IMPRESSION: No active disease. Electronically  Signed   By: Andrea Gasman M.D.   On: 01/10/2024 16:15   (Echo, Carotid, EGD, Colonoscopy, ERCP)    Subjective: Pt c/o intermittent nausea   Discharge Exam: Vitals:   01/13/24 0346 01/13/24 0756  BP: (!) 108/55 128/72  Pulse: 81 77  Resp: 18 16  Temp: 98.3 F (36.8 C) 99.1 F (37.3 C)  SpO2: 97% 99%   Vitals:   01/12/24 1546 01/12/24 2117 01/13/24 0346 01/13/24 0756  BP: 130/87 (!) 173/77 (!) 108/55 128/72  Pulse: 90 89 81 77  Resp:  17 18 16   Temp: 99.2 F (37.3 C) 98.4 F (36.9 C) 98.3 F (36.8 C) 99.1 F (37.3 C)  TempSrc: Oral  Oral Oral  SpO2: 99% 100% 97% 99%  Weight:      Height:        General: Pt is alert, awake, not in acute distress Cardiovascular: S1/S2 +, no rubs, no gallops Respiratory: CTA bilaterally, no wheezing, no rhonchi Abdominal: Soft, NT, ND, bowel sounds + Extremities: no edema, no cyanosis    The results of significant diagnostics from this hospitalization (including imaging, microbiology, ancillary and laboratory) are listed below for reference.     Microbiology: Recent Results (from the past 240 hours)  Blood culture (single)     Status: None (Preliminary result)   Collection Time: 01/10/24  2:42 PM   Specimen: BLOOD  Result Value Ref Range Status   Specimen Description BLOOD BLOOD LEFT ARM  Final   Special Requests   Final    BOTTLES DRAWN AEROBIC AND ANAEROBIC Blood Culture results may not be optimal due to an inadequate volume of blood received in culture bottles   Culture   Final    NO GROWTH 3 DAYS Performed at [facility], [address] [place], [place] [address]    Report Status PENDING  Incomplete     Labs: BNP (last 3 results) No results for input(s): BNP in the last 8760 hours. Basic Metabolic Panel: Recent Labs  Lab 01/10/24 1442 01/11/24 0446 01/12/24 0412 01/13/24 0319  NA 132* 136 136 136  K 3.7 3.8 3.3* 4.0  CL 93* 101 104 101  CO2 19* 20* 26 27  GLUCOSE 304* 182* 169* 131*  BUN  71* 64* 36* 21*  CREATININE 4.71* 3.26* 1.78* 1.33*  CALCIUM  10.2 9.0 8.7* 8.6*  MG  --   --  1.3* 1.6*   Liver Function Tests: Recent Labs  Lab 01/10/24 1442  AST 18  ALT 19  ALKPHOS 160*  BILITOT 1.2  PROT 8.8*  ALBUMIN 4.6   Recent Labs  Lab 01/10/24 1442  LIPASE 46   No results for input(s): AMMONIA in the last 168 hours. CBC: Recent Labs  Lab 01/10/24 1442 01/11/24 0446 01/12/24 0412 01/13/24 0319  WBC 23.7* 10.3 8.7 8.5  NEUTROABS 18.9*  --   --   --  HGB 15.8* 12.1 11.5* 11.2*  HCT 48.7* 35.7* 34.7* 33.5*  MCV 85.9 84.2 84.8 85.5  PLT 450* 289 233 218   Cardiac Enzymes: No results for input(s): CKTOTAL, CKMB, CKMBINDEX, TROPONINI in the last 168 hours. BNP: Invalid input(s): POCBNP CBG: Recent Labs  Lab 01/12/24 0820 01/12/24 1145 01/12/24 1709 01/13/24 0758 01/13/24 1143  GLUCAP 142* 168* 162* 128* 206*   D-Dimer No results for input(s): DDIMER in the last 72 hours. Hgb A1c No results for input(s): HGBA1C in the last 72 hours. Lipid Profile No results for input(s): CHOL, HDL, LDLCALC, TRIG, CHOLHDL, LDLDIRECT in the last 72 hours. Thyroid function studies No results for input(s): TSH, T4TOTAL, T3FREE, THYROIDAB in the last 72 hours.  Invalid input(s): FREET3 Anemia work up No results for input(s): VITAMINB12, FOLATE, FERRITIN, TIBC, IRON, RETICCTPCT in the last 72 hours. Urinalysis    Component Value Date/Time   COLORURINE YELLOW (A) 01/11/2024 1010   APPEARANCEUR CLOUDY (A) 01/11/2024 1010   APPEARANCEUR CLOUDY 05/29/2013 1801   LABSPEC 1.008 01/11/2024 1010   LABSPEC >=1.030 05/29/2013 1801   PHURINE 5.0 01/11/2024 1010   GLUCOSEU >=500 (A) 01/11/2024 1010   GLUCOSEU NEGATIVE 05/29/2013 1801   HGBUR LARGE (A) 01/11/2024 1010   BILIRUBINUR NEGATIVE 01/11/2024 1010   BILIRUBINUR NEGATIVE 05/29/2013 1801   KETONESUR NEGATIVE 01/11/2024 1010   PROTEINUR 30 (A) 01/11/2024 1010    NITRITE NEGATIVE 01/11/2024 1010   LEUKOCYTESUR LARGE (A) 01/11/2024 1010   LEUKOCYTESUR 2+ 05/29/2013 1801   Sepsis Labs Recent Labs  Lab 01/10/24 1442 01/11/24 0446 01/12/24 0412 01/13/24 0319  WBC 23.7* 10.3 8.7 8.5   Microbiology Recent Results (from the past 240 hours)  Blood culture (single)     Status: None (Preliminary result)   Collection Time: 01/10/24  2:42 PM   Specimen: BLOOD  Result Value Ref Range Status   Specimen Description BLOOD BLOOD LEFT ARM  Final   Special Requests   Final    BOTTLES DRAWN AEROBIC AND ANAEROBIC Blood Culture results may not be optimal due to an inadequate volume of blood received in culture bottles   Culture   Final    NO GROWTH 3 DAYS Performed at [facility], [address] [place], [place] [address]    Report Status PENDING  Incomplete     Time coordinating discharge: 33 minutes  SIGNED:   Anthony CHRISTELLA Pouch, MD  [facility] 01/13/2024, 1:05 PM Pager   If 7PM-7AM, please contact night-coverage [email or web address]

## 2024-01-13 NOTE — Plan of Care (Signed)
  Problem: Coping: Goal: Ability to adjust to condition or change in health will improve Outcome: Progressing   Problem: Metabolic: Goal: Ability to maintain appropriate glucose levels will improve Outcome: Progressing   Problem: Education: Goal: Knowledge of General Education information will improve Description: Including pain rating scale, medication(s)/side effects and non-pharmacologic comfort measures Outcome: Progressing   Problem: Clinical Measurements: Goal: Diagnostic test results will improve Outcome: Progressing

## 2024-01-15 LAB — CULTURE, BLOOD (SINGLE): Culture: NO GROWTH
